# Patient Record
Sex: Male | Born: 1985 | Race: White | Hispanic: No | Marital: Single | Smoking: Current every day smoker
Health system: Southern US, Community
[De-identification: ages and names within clinical notes are randomized; demographics above are authoritative.]

## PROBLEM LIST (undated history)

## (undated) DIAGNOSIS — S42401A Unspecified fracture of lower end of right humerus, initial encounter for closed fracture: Secondary | ICD-10-CM

## (undated) DIAGNOSIS — J45909 Unspecified asthma, uncomplicated: Secondary | ICD-10-CM

## (undated) HISTORY — PX: TOOTH EXTRACTION: SUR596

## (undated) HISTORY — PX: KNEE ARTHROSCOPY: SHX127

## (undated) HISTORY — PX: HAND SURGERY: SHX662

---

## 2004-01-20 HISTORY — PX: ELBOW FRACTURE SURGERY: SHX616

## 2013-12-14 ENCOUNTER — Emergency Department (HOSPITAL_COMMUNITY): Payer: Self-pay

## 2013-12-14 ENCOUNTER — Encounter (HOSPITAL_COMMUNITY): Payer: Self-pay | Admitting: Emergency Medicine

## 2013-12-14 ENCOUNTER — Inpatient Hospital Stay (HOSPITAL_COMMUNITY)
Admission: EM | Admit: 2013-12-14 | Discharge: 2013-12-16 | DRG: 502 | Disposition: A | Discharge status: Home / Self Care | Payer: Self-pay | Attending: Family Medicine | Admitting: Family Medicine

## 2013-12-14 DIAGNOSIS — Z87891 Personal history of nicotine dependence: Secondary | ICD-10-CM

## 2013-12-14 DIAGNOSIS — L03113 Cellulitis of right upper limb: Secondary | ICD-10-CM

## 2013-12-14 DIAGNOSIS — L0390 Cellulitis, unspecified: Secondary | ICD-10-CM | POA: Insufficient documentation

## 2013-12-14 DIAGNOSIS — M71121 Other infective bursitis, right elbow: Principal | ICD-10-CM | POA: Diagnosis present

## 2013-12-14 DIAGNOSIS — L03119 Cellulitis of unspecified part of limb: Secondary | ICD-10-CM | POA: Diagnosis present

## 2013-12-14 DIAGNOSIS — M7021 Olecranon bursitis, right elbow: Secondary | ICD-10-CM

## 2013-12-14 DIAGNOSIS — D649 Anemia, unspecified: Secondary | ICD-10-CM | POA: Diagnosis present

## 2013-12-14 DIAGNOSIS — M25529 Pain in unspecified elbow: Secondary | ICD-10-CM

## 2013-12-14 HISTORY — DX: Unspecified asthma, uncomplicated: J45.909

## 2013-12-14 HISTORY — DX: Unspecified fracture of lower end of right humerus, initial encounter for closed fracture: S42.401A

## 2013-12-14 LAB — CBC WITH DIFFERENTIAL/PLATELET
BASOS ABS: 0 10*3/uL (ref 0.0–0.1)
BASOS PCT: 0 % (ref 0–1)
Eosinophils Absolute: 0 10*3/uL (ref 0.0–0.7)
Eosinophils Relative: 0 % (ref 0–5)
HCT: 37.4 % — ABNORMAL LOW (ref 39.0–52.0)
Hemoglobin: 12.7 g/dL — ABNORMAL LOW (ref 13.0–17.0)
LYMPHS PCT: 23 % (ref 12–46)
Lymphs Abs: 2.6 10*3/uL (ref 0.7–4.0)
MCH: 32 pg (ref 26.0–34.0)
MCHC: 34 g/dL (ref 30.0–36.0)
MCV: 94.2 fL (ref 78.0–100.0)
MONO ABS: 1.5 10*3/uL — AB (ref 0.1–1.0)
Monocytes Relative: 13 % — ABNORMAL HIGH (ref 3–12)
NEUTROS PCT: 64 % (ref 43–77)
Neutro Abs: 7.2 10*3/uL (ref 1.7–7.7)
Platelets: 206 10*3/uL (ref 150–400)
RBC: 3.97 MIL/uL — ABNORMAL LOW (ref 4.22–5.81)
RDW: 11.5 % (ref 11.5–15.5)
WBC: 11.5 10*3/uL — ABNORMAL HIGH (ref 4.0–10.5)

## 2013-12-14 LAB — BASIC METABOLIC PANEL
Anion gap: 14 (ref 5–15)
BUN: 16 mg/dL (ref 6–23)
CHLORIDE: 97 meq/L (ref 96–112)
CO2: 25 meq/L (ref 19–32)
Calcium: 9.6 mg/dL (ref 8.4–10.5)
Creatinine, Ser: 1.14 mg/dL (ref 0.50–1.35)
GFR calc non Af Amer: 85 mL/min — ABNORMAL LOW (ref >90)
Glucose, Bld: 109 mg/dL — ABNORMAL HIGH (ref 70–99)
POTASSIUM: 3.9 meq/L (ref 3.7–5.3)
Sodium: 136 mEq/L — ABNORMAL LOW (ref 137–147)

## 2013-12-14 LAB — SEDIMENTATION RATE: Sed Rate: 40 mm/hr — ABNORMAL HIGH (ref 0–16)

## 2013-12-14 MED ORDER — VANCOMYCIN HCL IN DEXTROSE 1-5 GM/200ML-% IV SOLN: 1000.0000 mg | Freq: Once | INTRAVENOUS | Status: DC

## 2013-12-14 MED ORDER — MORPHINE SULFATE 2 MG/ML IJ SOLN
1.0000 mg | INTRAMUSCULAR | Status: DC | PRN
Start: 2013-12-14 — End: 2013-12-15

## 2013-12-14 MED ORDER — PIPERACILLIN-TAZOBACTAM 3.375 G IVPB
3.3750 g | Freq: Three times a day (TID) | INTRAVENOUS | Status: DC
  Administered 2013-12-14 – 2013-12-16 (×5): 3.375 g via INTRAVENOUS
  Filled 2013-12-14 (×5): qty 50

## 2013-12-14 MED ORDER — ACETAMINOPHEN 325 MG PO TABS: 650.0000 mg | ORAL_TABLET | Freq: Four times a day (QID) | ORAL | Status: DC | PRN

## 2013-12-14 MED ORDER — SODIUM CHLORIDE 0.9 % IV BOLUS (SEPSIS)
1000.0000 mL | Freq: Once | INTRAVENOUS | Status: DC
  Administered 2013-12-14: 1000 mL via INTRAVENOUS

## 2013-12-14 MED ORDER — ONDANSETRON HCL 4 MG/2ML IJ SOLN
4.0000 mg | Freq: Once | INTRAMUSCULAR | Status: AC
  Administered 2013-12-14: 4 mg via INTRAVENOUS
  Filled 2013-12-14: qty 2

## 2013-12-14 MED ORDER — ONDANSETRON HCL 4 MG/2ML IJ SOLN: 4.0000 mg | Freq: Four times a day (QID) | INTRAMUSCULAR | Status: DC | PRN

## 2013-12-14 MED ORDER — SODIUM CHLORIDE 0.9 % IV BOLUS (SEPSIS)
1000.0000 mL | Freq: Once | INTRAVENOUS | Status: AC
  Administered 2013-12-14: 1000 mL via INTRAVENOUS

## 2013-12-14 MED ORDER — ACETAMINOPHEN 650 MG RE SUPP: 650.0000 mg | Freq: Four times a day (QID) | RECTAL | Status: DC | PRN

## 2013-12-14 MED ORDER — VANCOMYCIN HCL IN DEXTROSE 1-5 GM/200ML-% IV SOLN
1000.0000 mg | Freq: Once | INTRAVENOUS | Status: AC
  Administered 2013-12-14: 1000 mg via INTRAVENOUS
  Filled 2013-12-14: qty 200

## 2013-12-14 MED ORDER — ONDANSETRON HCL 4 MG PO TABS: 4.0000 mg | ORAL_TABLET | Freq: Four times a day (QID) | ORAL | Status: DC | PRN

## 2013-12-14 MED ORDER — ENOXAPARIN SODIUM 40 MG/0.4ML ~~LOC~~ SOLN
40.0000 mg | SUBCUTANEOUS | Status: DC
  Administered 2013-12-15 – 2013-12-16 (×2): 40 mg via SUBCUTANEOUS
  Filled 2013-12-14 (×3): qty 0.4

## 2013-12-14 MED ORDER — SODIUM CHLORIDE 0.9 % IV SOLN
INTRAVENOUS | Status: DC
  Administered 2013-12-14: 23:00:00 via INTRAVENOUS

## 2013-12-14 MED ORDER — VANCOMYCIN HCL IN DEXTROSE 1-5 GM/200ML-% IV SOLN
1000.0000 mg | Freq: Three times a day (TID) | INTRAVENOUS | Status: DC
  Administered 2013-12-15: 1000 mg via INTRAVENOUS
  Filled 2013-12-14 (×3): qty 200

## 2013-12-14 NOTE — H&P (Addendum)
[facility] History and Physical  Richard Humphrey ZOX:[id number] DOB: March 22, 1985 DOA: 12/14/2013  Referring physician: ER physician. PCP: No primary care provider on file.   Chief Complaint: Right elbow swelling and pain.  HPI: Richard GustinBobby Humphrey is a 28 y.o. male with no significant past medical history presents with worsening swelling and pain of the right elbow. Patient states that he noticed a small pimple like lesion last month which suddenly grew big last Monday 5 days ago. He had gone to his PCP on Tuesday and had incision and drainage and antibiotics started. Despite which patient's lesion became bigger and painful and increasing discharge. In the ER patient was seen by Dr. Carola FrostHandy on call hand surgeon and at this time Dr. Carola FrostHandy has requested admission and IV antibiotics. In the ER and Dr. Carola FrostHandy has done incision and drainage and patient has been placed on sling. Patient has had a past medical history of having fractured the elbow and has metal plates inside. Patient also had some subjective feeling of fever chills. Patient otherwise denies any chest pain shortness of breath nausea vomiting abdominal pain diarrhea.   Review of Systems: As presented in the history of presenting illness, rest negative.  Past Medical History  Diagnosis Date  . Elbow fracture, right     with surgery  . Asthma     children    Past Surgical History  Procedure Laterality Date  . Knee arthroscopy Left   . Hand surgery Right    Social History:  reports that he has quit smoking. He does not have any smokeless tobacco history on file. He reports that he does not drink alcohol or use illicit drugs. Where does patient live home. Can patient participate in ADLs? Yes.  No Known Allergies  Family History:  Family History  Problem Relation Age of Onset  . Diabetes Mellitus II Neg Hx       Prior to Admission medications   Medication Sig Start Date End Date Taking? Authorizing Provider   Diphenhydramine-APAP, sleep, (GOODYS PM PO) Take 1 packet by mouth daily as needed (pain).   Yes Historical Provider, MD  ibuprofen (ADVIL,MOTRIN) 200 MG tablet Take 800 mg by mouth 2 (two) times daily as needed for moderate pain (pain).   Yes Historical Provider, MD  minocycline (MINOCIN,DYNACIN) 100 MG capsule Take 100 mg by mouth 2 (two) times daily.   Yes Historical Provider, MD    Physical Exam: Filed Vitals:   12/14/13 1803 12/14/13 1831  BP: 124/79   Pulse: 111   Temp: 98.8 F (37.1 C)   TempSrc: Oral   Resp: 16   Height:  6\' 2"  (1.88 m)  Weight:  77.565 kg (171 lb)  SpO2: 100%      General:  Well-developed and nourished.  Eyes: Anicteric no pallor.  ENT: No discharge from the ears eyes nose and mouth.  Neck: No mass felt.  Cardiovascular: S1-S2 heard.  Respiratory: No rhonchi or crepitations.  Abdomen: Soft nontender bowel sounds present.  Skin: Patient's right elbow has been already dressed up after Dr. Carola FrostHandy is seen and patient has been placed on a sling at this time.  Musculoskeletal: Patient's right hand has been already placed on sling. Has good pulses. Patient is able to make a fist.  Psychiatric: Appears normal.  Neurologic: Alert awake oriented to time place and person. Moves all extremities.  Labs on Admission:  Basic Metabolic Panel:  Recent Labs Lab 12/14/13 2007  NA 136*  K 3.9  CL 97  CO2 25  GLUCOSE 109*  BUN 16  CREATININE 1.14  CALCIUM 9.6   Liver Function Tests: No results for input(s): AST, ALT, ALKPHOS, BILITOT, PROT, ALBUMIN in the last 168 hours. No results for input(s): LIPASE, AMYLASE in the last 168 hours. No results for input(s): AMMONIA in the last 168 hours. CBC:  Recent Labs Lab 12/14/13 2007  WBC 11.5*  NEUTROABS 7.2  HGB 12.7*  HCT 37.4*  MCV 94.2  PLT 206   Cardiac Enzymes: No results for input(s): CKTOTAL, CKMB, CKMBINDEX, TROPONINI in the last 168 hours.  BNP (last 3 results) No results for  input(s): PROBNP in the last 8760 hours. CBG: No results for input(s): GLUCAP in the last 168 hours.  Radiological Exams on Admission: Dg Elbow Complete Right  12/14/2013   CLINICAL DATA:  Redness and swelling for 4 days  EXAM: RIGHT ELBOW - COMPLETE 3+ VIEW  COMPARISON:  None.  FINDINGS: Frontal, lateral, and bilateral oblique views were obtained. There is postoperative change throughout the proximal ulna as well as in the distal humerus. No acute fracture or dislocation is seen. There is no appreciable joint effusion. 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 There is no erosive change or bony destruction appreciable. There is soft tissue swelling in the region of the olecranon bursa. There is no air-fluid level to suggest abscess.  IMPRESSION: Soft tissues swelling in the olecranon bursa region. No soft tissue abscess is seen by radiography. 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 No bony destruction or erosion. No fracture or dislocation.   Electronically Signed   By: Bretta BangWilliam  Woodruff M.D.   On: 12/14/2013 19:11     Assessment/Plan Principal Problem:   Cellulitis of elbow Active Problems:   Normocytic anemia   1. Right elbow cellulitis and abscess - Dr. Carola FrostHandy has incised and drained in the ER. I did discuss with Dr. Carola FrostHandy. At this time Dr. Carola FrostHandy has advised patient to be placed on antibiotics and patient's wound culture has been sent. Further antibiotics will be based on patient's wound cultures. Dr. Carola FrostHandy will be seeing in outpatient on Wednesday, December 2 in his office but advised that patient will need at least 2 days of IV antibiotics and further oral antibiotics should be based on cultures results. If patient's symptoms worsened while in the hospital to call Dr. Carola FrostHandy back again otherwise patient will be seen as outpatient and further workup would be done as outpatient including further procedures later. 2. Anemia, normocytic normochromic - check anemia  panel and if there is no significant fall in hemoglobin patient will definitely need further workup as outpatient. Patient states there is no family history of colon cancer or stomach cancer. Patient states he has been recently taking ibuprofen for pain but has not noticed any blood in the stools. I have advised patient that he will need further workup on this as outpatient and patient agreed to pursue as outpatient.    Code Status: Full code.  Family Communication: None.  Disposition Plan: Admit to inpatient.    Bennetta Rudden N. [facility] Pager [phone].  If 7PM-7AM, please contact night-coverage [email or web address] Password [email or web address] 12/14/2013, 9:45 PM

## 2013-12-14 NOTE — ED Provider Notes (Signed)
CSN: [id number]     Arrival date & time 12/14/13  1748 History   First MD Initiated Contact with Patient 12/14/13 1749     Chief Complaint  Patient presents with  . Elbow Pain     (Consider location/radiation/quality/duration/timing/severity/associated sxs/prior Treatment) HPI    Patient to the ER with complaints of right elbow pain. He reports that the symptoms started off as a pimple on his elbow that opened and drained. Then 4 days ago it started to get worse, he saw a PCP at [facility] where fluid was drawn (unclear if from Bursitis or intra articular) and started on Doxycycline. He reports that he has had three doses of the medications but the swelling in his elbow is now red, weeping, and redness and pain has spread to the wrist and up towards the shoulder. He reports low grade fevers and myalgias (primarily in the low back pain and legs). Pt does not appear toxic, but has a pulse of 111.  Patient has hardware in right elbow from previous injury.   Past Medical History  Diagnosis Date  . Elbow fracture, right     with surgery  . Asthma     children    Past Surgical History  Procedure Laterality Date  . Knee arthroscopy Left   . Hand surgery Right    No family history on file. History  Substance Use Topics  . Smoking status: Former Games developermoker  . Smokeless tobacco: Not on file  . Alcohol Use: No    Review of Systems 10 Systems reviewed and are negative for acute change except as noted in the HPI.      Allergies  Review of patient's allergies indicates no known allergies.  Home Medications   Prior to Admission medications   Medication Sig Start Date End Date Taking? Authorizing Provider  Diphenhydramine-APAP, sleep, (GOODYS PM PO) Take 1 packet by mouth daily as needed (pain).   Yes Historical Provider, MD  ibuprofen (ADVIL,MOTRIN) 200 MG tablet Take 800 mg by mouth 2 (two) times daily as needed for moderate pain (pain).   Yes Historical Provider, MD    minocycline (MINOCIN,DYNACIN) 100 MG capsule Take 100 mg by mouth 2 (two) times daily.   Yes Historical Provider, MD   BP 124/79 mmHg  Pulse 111  Temp(Src) 98.8 F (37.1 C) (Oral)  Resp 16  Ht 6\' 2"  (1.88 m)  Wt 171 lb (77.565 kg)  BMI 21.95 kg/m2  SpO2 100% Physical Exam  Constitutional: He appears well-developed and well-nourished. No distress.  HENT:  Head: Normocephalic and atraumatic.  Eyes: Pupils are equal, round, and reactive to light.  Neck: Normal range of motion. Neck supple.  Cardiovascular: Normal rate and regular rhythm.   Pulmonary/Chest: Effort normal.  Abdominal: Soft.  Musculoskeletal:       Right elbow: He exhibits decreased range of motion (unable to  fully extend his elbow), swelling (significant amount of swellling) and effusion. Tenderness found. Olecranon process tenderness noted.       Arms: Significantly amount of swelling and cellulitis to the right arm. Pain with resistance against flexion.  Neurological: He is alert.  Skin: Skin is warm and dry.  Nursing note and vitals reviewed.   ED Course  Procedures (including critical care time) Labs Review Labs Reviewed  CBC WITH DIFFERENTIAL - Abnormal; Notable for the following:    WBC 11.5 (*)    RBC 3.97 (*)    Hemoglobin 12.7 (*)    HCT 37.4 (*)  Monocytes Relative 13 (*)    Monocytes Absolute 1.5 (*)    All other components within normal limits  BASIC METABOLIC PANEL - Abnormal; Notable for the following:    Sodium 136 (*)    Glucose, Bld 109 (*)    GFR calc non Af Amer 85 (*)    All other components within normal limits  CULTURE, BLOOD (ROUTINE X 2)  CULTURE, BLOOD (ROUTINE X 2)  SEDIMENTATION RATE  C-REACTIVE PROTEIN    Imaging Review Dg Elbow Complete Right  12/14/2013   CLINICAL DATA:  Redness and swelling for 4 days  EXAM: RIGHT ELBOW - COMPLETE 3+ VIEW  COMPARISON:  None.  FINDINGS: Frontal, lateral, and bilateral oblique views were obtained. There is postoperative change  throughout the proximal ulna as well as in the distal humerus. No acute fracture or dislocation is seen. There is no appreciable joint effusion. 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 There is no erosive change or bony destruction appreciable. There is soft tissue swelling in the region of the olecranon bursa. There is no air-fluid level to suggest abscess.  IMPRESSION: Soft tissues swelling in the olecranon bursa region. No soft tissue abscess is seen by radiography. 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 No bony destruction or erosion. No fracture or dislocation.   Electronically Signed   By: Bretta BangWilliam  Woodruff M.D.   On: 12/14/2013 19:11     EKG Interpretation None      MDM   Final diagnoses:  Elbow pain  Cellulitis of right arm  Bursitis of elbow, right    Medications  sodium chloride 0.9 % bolus 1,000 mL (1,000 mLs Intravenous New Bag/Given 12/14/13 2036)  vancomycin (VANCOCIN) IVPB 1000 mg/200 mL premix (1,000 mg Intravenous New Bag/Given 12/14/13 2044)  sodium chloride 0.9 % bolus 1,000 mL (not administered)  ondansetron (ZOFRAN) injection 4 mg (4 mg Intravenous Given 12/14/13 2041)   Infectious is significant. Pt is having low grade temperatures, myalgias and worsening infection despite 3 does of antibiotics.  His PCP drained fluid from it a few days ago and since it is Thanksgiving I will not be able to obtain the results today.. Discussed case with Dr. Criss AlvineGoldston, S.  7:30 pm I did not order blood cultures but I asked the nurse to order them before starting abx in case the Admitting provider wants them  8:30 pm IV Vancomycin 1g  8:40 pm I spoke with Dr. Carola FrostHandy, he requests the patient stay NPO. Also recommends adding on CRP and SED.  Dr. Toniann FailKakrakandy has agreed to admit and has put in orders for this patient.   Dorthula Matasiffany G Lenox Ladouceur, PA-C 12/14/13 2111  Audree CamelScott T Goldston, MD 12/14/13 [phone]

## 2013-12-14 NOTE — Progress Notes (Signed)
ANTIBIOTIC CONSULT NOTE - INITIAL  Pharmacy Consult for Vancomycin and Zosyn Indication: elbow cellulitis with suspected abscess  No Known Allergies  Patient Measurements: Height: 6\' 2"  (188 cm) Weight: 171 lb (77.565 kg) IBW/kg (Calculated) : 82.2  Vital Signs: Temp: 98.5 F (36.9 C) (11/26 2149) Temp Source: Oral (11/26 2149) BP: 123/66 mmHg (11/26 2149) Pulse Rate: 75 (11/26 2149) Intake/Output from previous day:   Intake/Output from this shift:    Labs:  Recent Labs  12/14/13 2007  WBC 11.5*  HGB 12.7*  PLT 206  CREATININE 1.14   Estimated Creatinine Clearance: 104 mL/min (by C-G formula based on Cr of 1.14). No results for input(s): VANCOTROUGH, VANCOPEAK, VANCORANDOM, GENTTROUGH, GENTPEAK, GENTRANDOM, TOBRATROUGH, TOBRAPEAK, TOBRARND, AMIKACINPEAK, AMIKACINTROU, AMIKACIN in the last 72 hours.   Microbiology: No results found for this or any previous visit (from the past 720 hour(s)).  Medical History: Past Medical History  Diagnosis Date  . Elbow fracture, right     with surgery  . Asthma     children     Medications:  Prescriptions prior to admission  Medication Sig Dispense Refill Last Dose  . Diphenhydramine-APAP, sleep, (GOODYS PM PO) Take 1 packet by mouth daily as needed (pain).   12/13/2013 at Unknown time  . ibuprofen (ADVIL,MOTRIN) 200 MG tablet Take 800 mg by mouth 2 (two) times daily as needed for moderate pain (pain).   12/14/2013 at Unknown time  . minocycline (MINOCIN,DYNACIN) 100 MG capsule Take 100 mg by mouth 2 (two) times daily.   12/14/2013 at 0930   Assessment: 28yo M with swollen right elbow, draining clear fluid. Pharmacy is asked to dose Vanc and Zosyn for extensive cellulitis and suspected abscess. First dose of Vanc given in ED.  11/26 >> Vanc >> 11/26 >> Zosyn >>    Tmax: AF WBCs: slightly elevated Renal: SCr 1.14, CrCl >100  11/26 blood x 2: 11/26 elbow joint fluid:   Goal of Therapy:  Vancomycin trough level 15-20  mcg/ml  Appropriate antibiotic dosing for renal function; eradication of infection  Plan:   Vancomycin 1g IV q8h.  Zosyn 3.375g IV Q8H infused over 4hrs.  Measure Vanc trough at steady state.  Follow up renal fxn and culture results.  Charolotte Ekeom Inga Noller, PharmD, pager [phone]. 12/14/2013,10:41 PM.

## 2013-12-14 NOTE — ED Notes (Addendum)
Pt from home c/o R elbow pain. Pt sts that he had a "pimple" on his elbow approx 1 month ago. Pt reports that when he woke up approx 4 days ago with redness, swelling and a small, weeping open wound. Wound is weeping clear fluid a this time. Pt reports that today redness and swelling that has spread to hand and around to medial and anterior forearm. Pt went to PCP 4 days ago and was given abx and has been taking with no relief. Pt adds that he had surgery in 2006 where plates, pins and a rod was placed. 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  Pt denies fevers, N/V/D. Pt is A&O and in NAD

## 2013-12-14 NOTE — Plan of Care (Signed)
Problem: Consults Goal: General Medical Patient Education See Patient Education Module for specific education. Outcome: Completed/Met Date Met:  12/14/13 Goal: Skin Care Protocol Initiated - if Braden Score 18 or less If consults are not indicated, leave blank or document N/A Outcome: Not Applicable Date Met:  62/86/38 Goal: Nutrition Consult-if indicated Outcome: Not Applicable Date Met:  17/71/16 Goal: Diabetes Guidelines if Diabetic/Glucose > 140 If diabetic or lab glucose is > 140 mg/dl - Initiate Diabetes/Hyperglycemia Guidelines & Document Interventions  Outcome: Not Applicable Date Met:  57/90/38  Problem: Phase I Progression Outcomes Goal: Pain controlled with appropriate interventions Outcome: Completed/Met Date Met:  12/14/13 Goal: OOB as tolerated unless otherwise ordered Outcome: Completed/Met Date Met:  12/14/13 Goal: Voiding-avoid urinary catheter unless indicated Outcome: Completed/Met Date Met:  12/14/13

## 2013-12-14 NOTE — ED Notes (Signed)
[facility] with a swollen right elbow.  Patient states approximately one week ago he noticed a "pimple," on his right elbow.  It briefly drained fluid and stopped.  Patient states several days ago the elbow began swelling again and became red and warm to the touch.  Patient endorses fever, but denies N/V.  Patient has +2 radial pulses in RUE.  Elbow is visibly swollen and erythematous.  Elbow is also warm to the touch and draining clear fluid with movement.

## 2013-12-14 NOTE — Consult Note (Signed)
[facility] Consultation  Reason for Consult: Right elbow septic olecranon bursitis Referring Physician: Rise Patience, MD  Richard Humphrey is an 28 y.o. male.  HPI: Several day h/o of swelling and tenderness with new onset drainage. Has been tapped by local MD with return of largely serous but slightly cloudy fluid; started on doxycycline at that time; it has since has turned much more viscous and erythema and swelling has expanded proximally and distally so that patient came into ED tonight.  Denies fever, but mild malaise.  Past Medical History  Diagnosis Date  . Elbow fracture, right     with surgery  . Asthma     children     Past Surgical History  Procedure Laterality Date  . Knee arthroscopy Left   . Hand surgery Right     Family History  Problem Relation Age of Onset  . Diabetes Mellitus II Neg Hx     Social History:  reports that he has quit smoking. He does not have any smokeless tobacco history on file. He reports that he does not drink alcohol or use illicit drugs.  Allergies: No Known Allergies  Medications: I have reviewed the patient's current medications.  Results for orders placed or performed during the hospital encounter of 12/14/13 (from the past 48 hour(s))  CBC with Differential     Status: Abnormal   Collection Time: 12/14/13  8:07 PM  Result Value Ref Range   WBC 11.5 (H) 4.0 - 10.5 K/uL   RBC 3.97 (L) 4.22 - 5.81 MIL/uL   Hemoglobin 12.7 (L) 13.0 - 17.0 g/dL   HCT 37.4 (L) 39.0 - 52.0 %   MCV 94.2 78.0 - 100.0 fL   MCH 32.0 26.0 - 34.0 pg   MCHC 34.0 30.0 - 36.0 g/dL   RDW 11.5 11.5 - 15.5 %   Platelets 206 150 - 400 K/uL   Neutrophils Relative % 64 43 - 77 %   Neutro Abs 7.2 1.7 - 7.7 K/uL   Lymphocytes Relative 23 12 - 46 %   Lymphs Abs 2.6 0.7 - 4.0 K/uL   Monocytes Relative 13 (H) 3 - 12 %   Monocytes Absolute 1.5 (H) 0.1 - 1.0 K/uL   Eosinophils Relative 0 0 - 5 %   Eosinophils Absolute 0.0 0.0 - 0.7 K/uL   Basophils Relative 0 0 - 1 %   Basophils Absolute 0.0 0.0 - 0.1 K/uL  Basic metabolic panel     Status: Abnormal   Collection Time: 12/14/13  8:07 PM  Result Value Ref Range   Sodium 136 (L) 137 - 147 mEq/L   Potassium 3.9 3.7 - 5.3 mEq/L   Chloride 97 96 - 112 mEq/L   CO2 25 19 - 32 mEq/L   Glucose, Bld 109 (H) 70 - 99 mg/dL   BUN 16 6 - 23 mg/dL   Creatinine, Ser 1.14 0.50 - 1.35 mg/dL   Calcium 9.6 8.4 - 10.5 mg/dL   GFR calc non Af Amer 85 (L) >90 mL/min   GFR calc Af Amer >90 >90 mL/min    Comment: (NOTE) The eGFR has been calculated using the CKD EPI equation. This calculation has not been validated in all clinical situations. eGFR's persistently <90 mL/min signify possible Chronic Kidney Disease.    Anion gap 14 5 - 15  Sedimentation rate     Status: Abnormal   Collection Time: 12/14/13  8:07 PM  Result Value Ref Range   Sed Rate 40 (H) 0 -  16 mm/hr    Dg Elbow Complete Right  12/14/2013   CLINICAL DATA:  Redness and swelling for 4 days  EXAM: RIGHT ELBOW - COMPLETE 3+ VIEW  COMPARISON:  None.  FINDINGS: Frontal, lateral, and bilateral oblique views were obtained. There is postoperative change throughout the proximal ulna as well as in the distal humerus. No acute fracture or dislocation is seen. There is no appreciable joint effusion. 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 There is no erosive change or bony destruction appreciable. There is soft tissue swelling in the region of the olecranon bursa. There is no air-fluid level to suggest abscess.  IMPRESSION: Soft tissues swelling in the olecranon bursa region. No soft tissue abscess is seen by radiography. 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 No bony destruction or erosion. No fracture or dislocation.   Electronically Signed   By: Lowella Grip M.D.   On: 12/14/2013 19:11    ROS as above; No bleeding abnormalities, urologic dysfunction, GI problems, or weight gain.  Blood pressure  123/66, pulse 75, temperature 98.5 F (36.9 C), temperature source Oral, resp. rate 18, height 6' 2" (1.88 m), weight 171 lb (77.565 kg), SpO2 99 %. Physical Exam RUEx  Elbow tender, erythema, swelling posteriorly with fluctuant area; expressed 4-5cc of drainage which was sent for culture  No pain with AROM or PROM of elbow (w/o extremes), wrist or shoulder   Sens  Ax/R/M/U intact  Mot   Ax/ R/ PIN/ M/ AIN/ U intact  Rad 2+  Assessment/Plan: Right elbow septic olecranon bursitis without loose hardware  1. Expressed and cultured fluid 2. Applied compressive dressing 3. Long arm splint 4. IV Abx for next 36 hrs or so then conversion to PO 5. F/u with me on Wednesday, possibly even Monday of next week 6. Plan for elective removal of hardware once infection controlled  Altamese Freeburg, MD [facility] [phone] (p)   12/14/2013  11:45 PM

## 2013-12-15 LAB — BASIC METABOLIC PANEL
Anion gap: 9 (ref 5–15)
BUN: 15 mg/dL (ref 6–23)
CALCIUM: 9.1 mg/dL (ref 8.4–10.5)
CO2: 26 meq/L (ref 19–32)
Chloride: 104 mEq/L (ref 96–112)
Creatinine, Ser: 1.07 mg/dL (ref 0.50–1.35)
GFR calc Af Amer: >90 mL/min (ref >90)
GFR calc non Af Amer: >90 mL/min (ref >90)
Glucose, Bld: 102 mg/dL — ABNORMAL HIGH (ref 70–99)
Potassium: 4.7 mEq/L (ref 3.7–5.3)
SODIUM: 139 meq/L (ref 137–147)

## 2013-12-15 LAB — RETICULOCYTES
RBC.: 3.97 MIL/uL — ABNORMAL LOW (ref 4.22–5.81)
Retic Count, Absolute: 47.6 10*3/uL (ref 19.0–186.0)
Retic Ct Pct: 1.2 % (ref 0.4–3.1)

## 2013-12-15 LAB — IRON AND TIBC
Iron: 22 ug/dL — ABNORMAL LOW (ref 42–135)
SATURATION RATIOS: 10 % — AB (ref 20–55)
TIBC: 220 ug/dL (ref 215–435)
UIBC: 198 ug/dL (ref 125–400)

## 2013-12-15 LAB — CBC
HEMATOCRIT: 38.4 % — AB (ref 39.0–52.0)
Hemoglobin: 12.8 g/dL — ABNORMAL LOW (ref 13.0–17.0)
MCH: 32.2 pg (ref 26.0–34.0)
MCHC: 33.3 g/dL (ref 30.0–36.0)
MCV: 96.7 fL (ref 78.0–100.0)
Platelets: 173 10*3/uL (ref 150–400)
RBC: 3.97 MIL/uL — ABNORMAL LOW (ref 4.22–5.81)
RDW: 11.6 % (ref 11.5–15.5)
WBC: 8.1 10*3/uL (ref 4.0–10.5)

## 2013-12-15 LAB — FOLATE: Folate: 13.4 ng/mL

## 2013-12-15 LAB — MRSA PCR SCREENING: MRSA by PCR: NEGATIVE

## 2013-12-15 LAB — C-REACTIVE PROTEIN: CRP: 13.4 mg/dL — ABNORMAL HIGH (ref <0.60)

## 2013-12-15 LAB — FERRITIN: FERRITIN: 360 ng/mL — AB (ref 22–322)

## 2013-12-15 LAB — VITAMIN B12: Vitamin B-12: 381 pg/mL (ref 211–911)

## 2013-12-15 MED ORDER — AMOXICILLIN-POT CLAVULANATE 875-125 MG PO TABS: 1.0000 | ORAL_TABLET | Freq: Two times a day (BID) | ORAL | Status: DC

## 2013-12-15 MED ORDER — CIPROFLOXACIN HCL 500 MG PO TABS: 500.0000 mg | ORAL_TABLET | Freq: Two times a day (BID) | ORAL | Status: DC

## 2013-12-15 NOTE — Progress Notes (Addendum)
CARE MANAGEMENT NOTE 12/15/2013  Patient:  Richard Humphrey,Richard Humphrey   Account Number:  [id number]  Date Initiated:  12/15/2013  Documentation initiated by:  Ferdinand CavaSCHETTINO,Layia Walla  Subjective/Objective Assessment:   28 yo male admitted with cellulitis from home     Action/Plan:   discharge planning   Anticipated DC Date:  12/17/2013   Anticipated DC Plan:  HOME/SELF CARE      DC Planning Services  CM consult  PCP issues      Choice offered to / List presented to:             Status of service:  Completed, signed off Medicare Important Message given?   (If response is "NO", the following Medicare IM given date fields will be blank) Date Medicare IM given:   Medicare IM given by:   Date Additional Medicare IM given:   Additional Medicare IM given by:    Discharge Disposition:  HOME/SELF CARE  Per UR Regulation:  Reviewed for med. necessity/level of care/duration of stay  If discussed at Long Length of Stay Meetings, dates discussed:    Comments:  12/15/13 Ferdinand CavaAndrea Schettino RN BSN CM [phone] Patient [place] with [facility]. He stated the program is for 7 months and he is 3 months into the program. He stated that he has been in contact with the [facility] director. He stated that the program contracts with an MD in [place] but he is unable to afford the cost. Provided the [facility] and encouraged him to call and set up an initial appointment and apply for financial resources for greater affordability of care and ability to follow up with specialist, ie Ortho. Patient was appreciative of resources provided.  12/15/13 Ferdinand CavaAndrea Schettino RN BSN CM [phone] Discussed the [facility]ATCH program with the patient, explained that the copay for each medication will be $3 so he will have to pay a total of $6 for both discharge prescriptions, he can only use the program one  time in a year, and he has 7 days from the date of discharge to fill the prescriptions with [facility]. The patient stated that he has the $ to fill the prescriptions. M[facility] letter provided to patient with list of accepting pharmacies. Patient verbalized understanding with no questions or concerns.

## 2013-12-15 NOTE — Progress Notes (Signed)
CSW met with pt at bedside. Pt shared he is at [facility], Substance abuse residential rehab. Patient plans to return when medically stable. CSW spoke with Patrick Jupiter at [facility] who states that patient does not need anything besides a copy of his discharge papers and he can provide transportation when pt is discharged. No further Clinical Social Work needs, signing off.   Noreene Larsson [phone]  ED CSW 12/15/2013 1428pm

## 2013-12-15 NOTE — Discharge Instructions (Signed)
Orthopaedic DC instructions  Do not remove splint until follow up Do not get splint wet Ok to move fingers to help control swelling

## 2013-12-15 NOTE — Progress Notes (Signed)
  PROGRESS NOTE  Richard GustinBobby Humphrey WUJ:[id number] DOB: 30-Oct-1985 DOA: 12/14/2013 PCP: No primary care provider on file.  Summary: 28yom presented with right elbow septic olecranon bursitis. S/p I&D and admitted for IV abx.  Assessment/Plan: 1. Right olecranon septic bursitis. S/p I&D 11/26. Afebrile, vitals stable. Culture pending. Patient requests no narcotics (h/o narcotic abuse). 2. Mild normocytic anemia.   Improved per Dr. Carola FrostHandy. Plan IV abx today. Home on PO Cipro and Augmentin 11/28.  Sling for comfort.  Code Status: full code DVT prophylaxis: Lovenox Family Communication: none present Disposition Plan: home  Richard Sacksaniel Chrisoula Zegarra, MD  [facility]  Pager [phone] If 7PM-7AM, please contact night-coverage at [email or web address], password [email or web address] 12/15/2013, 2:07 PM  LOS: 1 day   Consultants:  Orthopedics  Procedures:   I&D 11/26  Antibiotics:  Zosyn 11/26 >>  Vancomycin 11/26 >> 11/27  HPI/Subjective: Better overall, has more swelling in hand. Minimal pain.  Objective: Filed Vitals:   12/14/13 1831 12/14/13 2149 12/15/13 0556 12/15/13 1328  BP:  123/66 105/56 115/69  Pulse:  75 74 84  Temp:  98.5 F (36.9 C) 97.7 F (36.5 C) 98.7 F (37.1 C)  TempSrc:  Oral Oral   Resp:  18 18 16   Height: 6\' 2"  (1.88 m)     Weight: 77.565 kg (171 lb)     SpO2:  99% 98% 100%    Intake/Output Summary (Last 24 hours) at 12/15/13 1407 Last data filed at 12/15/13 1100  Gross per 24 hour  Intake    240 ml  Output      0 ml  Net    240 ml     Filed Weights   12/14/13 1831  Weight: 77.565 kg (171 lb)    Exam:     Afebrile, VSS  General: appears calm and comfortable  Psych: alert, speech fluent and clear  CV: RRR no m/r/g. No LE edema.  Respiratory: CTA bilaterally no w/r/r. Normal resp effot.  Musculoskeletal: right hand slightly edematous, nontender, FROM, normal perfusion  Data Reviewed:  BMP, B12, CBC stable, WBC has normalized  Iron studies  equivocal  Scheduled Meds: . enoxaparin (LOVENOX) injection  40 mg Subcutaneous Q24H  . piperacillin-tazobactam (ZOSYN)  IV  3.375 g Intravenous Q8H  . vancomycin  1,000 mg Intravenous Q8H   Continuous Infusions: . sodium chloride 100 mL/hr at 12/14/13 2231    Principal Problem:   Cellulitis of elbow Active Problems:   Normocytic anemia   Time spent 20 minutes

## 2013-12-15 NOTE — Progress Notes (Addendum)
Orthopaedic Trauma Service (OTS)  Subjective: Feeling better this am with less pain  Objective:  Physical Exam Min edema of digits; splint fitting well; intact sens and motor In sling  Labs: CRP 13, ESR 40; Gram + cocci on wound drainage  Assessment/Plan: Continue with splint and compressive wrap until tomorrow; ok to leave on until follow up appt with me Mon, Wed at the latest; will sign off for now. Planning for empiric IV abx through today with d/c in am on PO Cipro and Augmentin Will tailor abx as cultures and sensitivities finalized. Sling for comfort only.   Richard Price, MD [facility] [phone] (p)

## 2013-12-16 MED ORDER — DOXYCYCLINE HYCLATE 100 MG PO TABS: 100.0000 mg | ORAL_TABLET | Freq: Two times a day (BID) | ORAL | Status: DC

## 2013-12-16 MED ORDER — AMOXICILLIN-POT CLAVULANATE 875-125 MG PO TABS
1.0000 | ORAL_TABLET | Freq: Two times a day (BID) | ORAL | Status: DC
  Administered 2013-12-16: 1 via ORAL
  Filled 2013-12-16 (×2): qty 1

## 2013-12-16 MED ORDER — CIPROFLOXACIN HCL 500 MG PO TABS
500.0000 mg | ORAL_TABLET | Freq: Two times a day (BID) | ORAL | Status: AC
  Administered 2013-12-16: 500 mg via ORAL
  Filled 2013-12-16: qty 1

## 2013-12-16 NOTE — Plan of Care (Signed)
Problem: Discharge Progression Outcomes Goal: Discharge plan in place and appropriate Outcome: Completed/Met Date Met:  12/16/13 Goal: Pain controlled with appropriate interventions Outcome: Completed/Met Date Met:  12/16/13 Goal: Hemodynamically stable Outcome: Completed/Met Date Met:  59/74/71 Goal: Complications resolved/controlled Outcome: Adequate for Discharge Goal: Tolerating diet Outcome: Completed/Met Date Met:  12/16/13 Goal: Activity appropriate for discharge plan Outcome: Completed/Met Date Met:  12/16/13

## 2013-12-16 NOTE — Discharge Summary (Signed)
Physician Discharge Summary  Richard GustinBobby Humphrey ZOX:[id number] DOB: 01/24/1985 DOA: 12/14/2013  PCP: No primary care provider on file.  Orthopedics: Dr. Carola FrostHandy  Admit date: 12/14/2013 Discharge date: 12/16/2013  Recommendations for Outpatient Follow-up:  1. Resolution of right olecranon septic bursitis. Cultures pending. 2. Borderline normocytic anemia.   Follow-up Information    Follow up with Richard PalmerHANDY,Richard H, MD On 12/20/2013.   Specialty:  Orthopedic Surgery   Why:  For wound re-check   Contact information:   [address] [place] [address] [phone]      Discharge Diagnoses:  1. Right olecranon septic bursitis 2. Borderline normocytic anemia   Discharge Condition: improved Disposition: home/[facility]  Diet recommendation: regular  Filed Weights   12/14/13 1831  Weight: 77.565 kg (171 lb)    History of present illness:  28yom presented with right elbow septic olecranon bursitis.   Hospital Course:  Underwent I&D per orthopedics and was admitted for IV abx. Condition improved, hospitalization uncomplicated. Culture data pending at time of discharge, will be followed by Dr. Carola FrostHandy. Mild normocytic anemia likely not clinically significant.  Consultants:  Orthopedics  Procedures:  I&D 11/26  Antibiotics:  Zosyn 11/26 >> 11/27  Vancomycin 11/26 >> 11/27  Cipro 11/28 >>   Augmentin 11/28 >>  Discharge Instructions  Discharge Instructions    Care order/instruction    Complete by:  As directed   Do not remove splint until follow up Do not get splint wet Ok to move fingers to help control swelling     Diet general    Complete by:  As directed      Discharge instructions    Complete by:  As directed   Call your physician or seek immediate medical attention for fever, increased pain, swelling, redness, drainage, arm or hand pain or numbness or worsening of condition.          Current Discharge Medication List    START  taking these medications   Details  amoxicillin-clavulanate (AUGMENTIN) 875-125 MG per tablet Take 1 tablet by mouth 2 (two) times daily. Qty: 42 tablet, Refills: 0    ciprofloxacin (CIPRO) 500 MG tablet Take 1 tablet (500 mg total) by mouth 2 (two) times daily. Qty: 42 tablet, Refills: 0      CONTINUE these medications which have NOT CHANGED   Details  ibuprofen (ADVIL,MOTRIN) 200 MG tablet Take 800 mg by mouth 2 (two) times daily as needed for moderate pain (pain).      STOP taking these medications     Diphenhydramine-APAP, sleep, (GOODYS PM PO)      minocycline (MINOCIN,DYNACIN) 100 MG capsule        No Known Allergies  The results of significant diagnostics from this hospitalization (including imaging, microbiology, ancillary and laboratory) are listed below for reference.    Significant Diagnostic Studies: Dg Elbow Complete Right  12/14/2013   CLINICAL DATA:  Redness and swelling for 4 days  EXAM: RIGHT ELBOW - COMPLETE 3+ VIEW  COMPARISON:  None.  FINDINGS: Frontal, lateral, and bilateral oblique views were obtained. There is postoperative change throughout the proximal ulna as well as in the distal humerus. No acute fracture or dislocation is seen. There is no appreciable joint effusion. 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 There is no erosive change or bony destruction appreciable. There is soft tissue swelling in the region of the olecranon bursa. There is no air-fluid level to suggest abscess.  IMPRESSION: Soft tissues swelling in the olecranon bursa region. No  soft tissue abscess is seen by radiography. 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 No bony destruction or erosion. No fracture or dislocation.   Electronically Signed   By: Bretta BangWilliam  Humphrey M.D.   On: 12/14/2013 19:11    Microbiology: Recent Results (from the past 240 hour(s))  Culture, routine-abscess     Status: None (Preliminary result)   Collection Time: 12/14/13  9:28 PM    Result Value Ref Range Status   Specimen Description WOUND ELBOW JOINT  Final   Special Requests Normal  Final   Gram Stain   Final    FEW WBC PRESENT,BOTH PMN AND MONONUCLEAR NO SQUAMOUS EPITHELIAL CELLS SEEN RARE GRAM POSITIVE COCCI IN PAIRS IN CLUSTERS Performed at [facility]    Culture NO GROWTH Performed at [facility]   Final   Report Status PENDING  Incomplete  MRSA PCR Screening     Status: None   Collection Time: 12/14/13 11:46 PM  Result Value Ref Range Status   MRSA by PCR NEGATIVE NEGATIVE Final    Comment:        The GeneXpert MRSA Assay (FDA approved for NASAL specimens only), is one component of a comprehensive MRSA colonization surveillance program. It is not intended to diagnose MRSA infection nor to guide or monitor treatment for MRSA infections.      Labs: Basic Metabolic Panel:  Recent Labs Lab 12/14/13 2007 12/15/13 0424  NA 136* 139  K 3.9 4.7  CL 97 104  CO2 25 26  GLUCOSE 109* 102*  BUN 16 15  CREATININE 1.14 1.07  CALCIUM 9.6 9.1   CBC:  Recent Labs Lab 12/14/13 2007 12/15/13 0424  WBC 11.5* 8.1  NEUTROABS 7.2  --   HGB 12.7* 12.8*  HCT 37.4* 38.4*  MCV 94.2 96.7  PLT 206 173    Principal Problem:   Cellulitis of elbow Active Problems:   Normocytic anemia   Time coordinating discharge: 20 minutes  Signed:  Brendia Sacksaniel Goodrich, MD [facility] 12/16/2013, 9:36 AM

## 2013-12-16 NOTE — Progress Notes (Signed)
  PROGRESS NOTE  Richard GustinBobby Harold ZOX:[id number] DOB: Oct 08, 1985 DOA: 12/14/2013 PCP: No primary care provider on file.  Summary: 28yom presented with right elbow septic olecranon bursitis. S/p I&D and admitted for IV abx.  Assessment/Plan: 1. Right olecranon septic bursitis. S/p I&D 11/26. Doing well.  2. Borderline normocytic anemia. Just below normal. Doubt significant.   Continues to improve afebrile. Plan for discharge today. Dr. Carola FrostHandy arranging close outpatient f/u and will f/u on culture data. Plan Augmentin and Cipro per Dr. Carola FrostHandy. (MRSA screen was negative).  No narcotics per patient request (h/o substance abuse). Has not even required Tylenol.  Sling for comfort.  Code Status: full code DVT prophylaxis: Lovenox Family Communication: none present Disposition Plan: home  Brendia Sacksaniel Terianne Thaker, MD  [facility]  Pager [phone] If 7PM-7AM, please contact night-coverage at [email or web address], password [email or web address] 12/16/2013, 9:17 AM  LOS: 2 days   Consultants:  Orthopedics  Procedures:   I&D 11/26  Antibiotics:  Zosyn 11/26 >> 11/27  Vancomycin 11/26 >> 11/27  Cipro 11/28 >>   Augmentin 11/28 >>   HPI/Subjective: Feeling much better. Hand swelling has resolved. Pain is improved.   Objective: Filed Vitals:   12/15/13 0556 12/15/13 1328 12/15/13 2035 12/16/13 0600  BP: 105/56 115/69 119/70 106/58  Pulse: 74 84 80 79  Temp: 97.7 F (36.5 C) 98.7 F (37.1 C) 98.5 F (36.9 C) 98.2 F (36.8 C)  TempSrc: Oral  Oral Oral  Resp: 18 16 17 16   Height:      Weight:      SpO2: 98% 100% 99% 98%    Intake/Output Summary (Last 24 hours) at 12/16/13 0917 Last data filed at 12/16/13 0645  Gross per 24 hour  Intake   1160 ml  Output      0 ml  Net   1160 ml     Filed Weights   12/14/13 1831  Weight: 77.565 kg (171 lb)    Exam:     Afebrile, vital signs stable.   Appears calm, comfortable. Speech fluent and clear.  Cardiovascular regular rate and rhythm. No  murmur, rub or gallop.  Respiratory clear to auscultation bilaterally. No wheezes, rales or rhonchi. Normal respiratory effort.  Right arm. Elbow bandaged. Forearm appears unremarkable. Strong radial pulse. Hand without edema. No erythema. Regimen movement appears grossly normal. Perfusion appears intact.  Data Reviewed:  Cultures are pending.  Scheduled Meds: . enoxaparin (LOVENOX) injection  40 mg Subcutaneous Q24H  . piperacillin-tazobactam (ZOSYN)  IV  3.375 g Intravenous Q8H   Continuous Infusions:    Principal Problem:   Cellulitis of elbow Active Problems:   Normocytic anemia

## 2013-12-18 LAB — CULTURE, ROUTINE-ABSCESS: Special Requests: NORMAL

## 2013-12-21 LAB — CULTURE, BLOOD (ROUTINE X 2)
CULTURE: NO GROWTH
Culture: NO GROWTH

## 2013-12-22 ENCOUNTER — Telehealth: Payer: Self-pay | Admitting: Family Medicine

## 2013-12-22 NOTE — Telephone Encounter (Signed)
Error- please disregard

## 2013-12-25 ENCOUNTER — Ambulatory Visit: Payer: Self-pay | Attending: Internal Medicine | Admitting: Internal Medicine

## 2013-12-25 ENCOUNTER — Encounter: Payer: Self-pay | Admitting: Internal Medicine

## 2013-12-25 VITALS — BP 112/76 | HR 78 | Temp 98.0°F | Resp 16 | Wt 176.8 lb

## 2013-12-25 DIAGNOSIS — Z23 Encounter for immunization: Secondary | ICD-10-CM | POA: Insufficient documentation

## 2013-12-25 DIAGNOSIS — Z87898 Personal history of other specified conditions: Secondary | ICD-10-CM | POA: Insufficient documentation

## 2013-12-25 DIAGNOSIS — D649 Anemia, unspecified: Secondary | ICD-10-CM | POA: Insufficient documentation

## 2013-12-25 DIAGNOSIS — J45909 Unspecified asthma, uncomplicated: Secondary | ICD-10-CM | POA: Insufficient documentation

## 2013-12-25 DIAGNOSIS — Z139 Encounter for screening, unspecified: Secondary | ICD-10-CM | POA: Insufficient documentation

## 2013-12-25 DIAGNOSIS — L03818 Cellulitis of other sites: Secondary | ICD-10-CM | POA: Insufficient documentation

## 2013-12-25 DIAGNOSIS — L03119 Cellulitis of unspecified part of limb: Secondary | ICD-10-CM

## 2013-12-25 DIAGNOSIS — Z2821 Immunization not carried out because of patient refusal: Secondary | ICD-10-CM | POA: Insufficient documentation

## 2013-12-25 DIAGNOSIS — Z87891 Personal history of nicotine dependence: Secondary | ICD-10-CM | POA: Insufficient documentation

## 2013-12-25 DIAGNOSIS — Z114 Encounter for screening for human immunodeficiency virus [HIV]: Secondary | ICD-10-CM | POA: Insufficient documentation

## 2013-12-25 DIAGNOSIS — F1911 Other psychoactive substance abuse, in remission: Secondary | ICD-10-CM | POA: Insufficient documentation

## 2013-12-25 LAB — COMPLETE METABOLIC PANEL WITH GFR
ALBUMIN: 4.4 g/dL (ref 3.5–5.2)
ALK PHOS: 54 U/L (ref 39–117)
ALT: 20 U/L (ref 0–53)
AST: 22 U/L (ref 0–37)
BUN: 14 mg/dL (ref 6–23)
CALCIUM: 10.2 mg/dL (ref 8.4–10.5)
CHLORIDE: 101 meq/L (ref 96–112)
CO2: 28 meq/L (ref 19–32)
CREATININE: 1.09 mg/dL (ref 0.50–1.35)
GLUCOSE: 87 mg/dL (ref 70–99)
POTASSIUM: 5.1 meq/L (ref 3.5–5.3)
Sodium: 140 mEq/L (ref 135–145)
Total Bilirubin: 0.3 mg/dL (ref 0.2–1.2)
Total Protein: 7.6 g/dL (ref 6.0–8.3)

## 2013-12-25 LAB — LIPID PANEL
CHOL/HDL RATIO: 3.5 ratio
CHOLESTEROL: 142 mg/dL (ref 0–200)
HDL: 41 mg/dL (ref >39)
LDL CALC: 84 mg/dL (ref 0–99)
Triglycerides: 84 mg/dL (ref <150)
VLDL: 17 mg/dL (ref 0–40)

## 2013-12-25 LAB — HEMOGLOBIN A1C
Hgb A1c MFr Bld: 5.3 % (ref <5.7)
MEAN PLASMA GLUCOSE: 105 mg/dL (ref <117)

## 2013-12-25 LAB — TSH: TSH: 3.283 u[IU]/mL (ref 0.350–4.500)

## 2013-12-25 NOTE — Progress Notes (Signed)
Patient here for hospital follow up and to establish care Had an injury to his right elbow back in 2006 Recently treated for cellulitis to the right elbow Denies any new injury stated does have screws in the elbow and might have irritated the area Currently taking cipro 500mg  BID

## 2013-12-25 NOTE — Progress Notes (Signed)
Patient Demographics  Richard Humphrey, is a 28 y.o. male  [id number]  NGE:[id number]  DOB - 06/05/1985  CC:  Chief Complaint  Patient presents with  . new patient       HPI: Richard Humphrey is a 28 y.o. male here today to establish medical care.Patient was recently hospitalized with right elbow septic olecranon bursitis, EMR reviewed patient underwent I&D and was given IV antibiotics and was discharged on Augmentin and Cipro,  as per patient he followed up with orthopedics and was advised to continue only with ciprofloxacin and discontinue Augmentin vision denies any fever chills denies any discharge has minimal pain , patient also reports prior history of substance abuse and is currently in the program, his blood work also noticed to have normocytic anemia patient denies any bleeding. Patient has No headache, No chest pain, No abdominal pain - No Nausea, No new weakness tingling or numbness, No Cough - SOB.  No Known Allergies Past Medical History  Diagnosis Date  . Elbow fracture, right     with surgery  . Asthma     children    Current Outpatient Prescriptions on File Prior to Visit  Medication Sig Dispense Refill  . amoxicillin-clavulanate (AUGMENTIN) 875-125 MG per tablet Take 1 tablet by mouth 2 (two) times daily. 42 tablet 0  . ciprofloxacin (CIPRO) 500 MG tablet Take 1 tablet (500 mg total) by mouth 2 (two) times daily. 42 tablet 0  . ibuprofen (ADVIL,MOTRIN) 200 MG tablet Take 800 mg by mouth 2 (two) times daily as needed for moderate pain (pain).     No current facility-administered medications on file prior to visit.   Family History  Problem Relation Age of Onset  . Diabetes Mellitus II Neg Hx    History   Social History  . Marital Status: Single    Spouse Name: N/A    Number of Children: N/A  . Years of Education: N/A   Occupational History  . Not on file.   Social History Main Topics  . Smoking status: Former Games developermoker  . Smokeless tobacco: Not on  file  . Alcohol Use: No  . Drug Use: No  . Sexual Activity: Not on file   Other Topics Concern  . Not on file   Social History Narrative    Review of Systems: Constitutional: Negative for fever, chills, diaphoresis, activity change, appetite change and fatigue. HENT: Negative for ear pain, nosebleeds, congestion, facial swelling, rhinorrhea, neck pain, neck stiffness and ear discharge.  Eyes: Negative for pain, discharge, redness, itching and visual disturbance. Respiratory: Negative for cough, choking, chest tightness, shortness of breath, wheezing and stridor.  Cardiovascular: Negative for chest pain, palpitations and leg swelling. Gastrointestinal: Negative for abdominal distention. Genitourinary: Negative for dysuria, urgency, frequency, hematuria, flank pain, decreased urine volume, difficulty urinating and dyspareunia.  Musculoskeletal: Negative for back pain, joint swelling, arthralgia and gait problem. Neurological: Negative for dizziness, tremors, seizures, syncope, facial asymmetry, speech difficulty, weakness, light-headedness, numbness and headaches.  Hematological: Negative for adenopathy. Does not bruise/bleed easily. Psychiatric/Behavioral: Negative for hallucinations, behavioral problems, confusion, dysphoric mood, decreased concentration and agitation.    Objective:   Filed Vitals:   12/25/13 1123  BP: 112/76  Pulse: 78  Temp: 98 F (36.7 C)  Resp: 16    Physical Exam: Constitutional: Patient appears well-developed and well-nourished. No distress. HENT: Normocephalic, atraumatic, External right and left ear normal. Oropharynx is clear and moist. Dental cavities. Eyes: Conjunctivae and EOM are normal. PERRLA, no scleral icterus.  Neck: Normal ROM. Neck supple. No JVD. No tracheal deviation. No thyromegaly. CVS: RRR, S1/S2 +, no murmurs, no gallops, no carotid bruit.  Pulmonary: Effort and breath sounds normal, no stridor, rhonchi, wheezes, rales.  Abdominal:  Soft. BS +, no distension, tenderness, rebound or guarding.  Musculoskeletal: right elbow erythema minimal swelling no apparent discharge no tenderness.  Neuro: Alert. Normal reflexes, muscle tone coordination. No cranial nerve deficit. Skin: Skin is warm and dry. No rash noted. Not diaphoretic. No erythema. No pallor. Psychiatric: Normal mood and affect. Behavior, judgment, thought content normal.  Lab Results  Component Value Date   WBC 8.1 12/15/2013   HGB 12.8* 12/15/2013   HCT 38.4* 12/15/2013   MCV 96.7 12/15/2013   PLT 173 12/15/2013   Lab Results  Component Value Date   CREATININE 1.07 12/15/2013   BUN 15 12/15/2013   NA 139 12/15/2013   K 4.7 12/15/2013   CL 104 12/15/2013   CO2 26 12/15/2013    No results found for: HGBA1C Lipid Panel  No results found for: CHOL, TRIG, HDL, CHOLHDL, VLDL, LDLCALC     Assessment and plan:   1. Cellulitis of elbow Currently patient is on antibiotic, scheduled to follow with orthopedics.  2. Screening Ordered baseline blood work. - COMPLETE METABOLIC PANEL WITH GFR - TSH - Lipid panel - Vit D  25 hydroxy (rtn osteoporosis monitoring) - Hemoglobin A1c  3. Need for Tdap vaccination Vaccination given today  4. History of substance abuse Will check - HIV antibody (with reflex)  Health Maintenance  -Vaccinations: tdap today  pateint declines flu shot   Return in about 3 months (around 03/26/2014).   Doris CheadleADVANI, Zakai Gonyea, MD

## 2013-12-26 ENCOUNTER — Telehealth: Payer: Self-pay | Admitting: Emergency Medicine

## 2013-12-26 LAB — VITAMIN D 25 HYDROXY (VIT D DEFICIENCY, FRACTURES): Vit D, 25-Hydroxy: 27 ng/mL — ABNORMAL LOW (ref 30–100)

## 2013-12-26 LAB — HIV ANTIBODY (ROUTINE TESTING W REFLEX): HIV 1&2 Ab, 4th Generation: NONREACTIVE

## 2013-12-26 NOTE — Telephone Encounter (Signed)
-----   Message from Doris Cheadleeepak Advani, MD sent at 12/26/2013  9:50 AM EST ----- Blood work reviewed, noticed low vitamin D, advise patient to start taking OTC 2000 units daily.

## 2013-12-26 NOTE — Telephone Encounter (Signed)
Number listed not available for VM

## 2014-01-04 ENCOUNTER — Encounter (HOSPITAL_COMMUNITY): Payer: Self-pay | Admitting: *Deleted

## 2014-01-04 NOTE — Progress Notes (Signed)
Please complete anesthesia complication assessment DOS since pre-op call was completed by pt counselor.

## 2014-01-04 NOTE — Progress Notes (Signed)
Pt SDW pre- call was completed by pt counselor Mr. Lucillie GarfinkelRandy McCoy ( # [phone]) at inpatient Rehab. Harvie HeckRandy advised that pt should not have any narcotics because pt is currently in recovery. Mr. Janece Canterburyat [facility], counselor will accompany pt to procedure ( # [phone]). Harvie HeckRandy made aware that pt should stop taking Aspirin, vitamins, and herbal medications. Do not take any NSAIDs ie: Ibuprofen, Advil, Naproxen or any medication containing Aspirin.

## 2014-01-05 ENCOUNTER — Encounter (HOSPITAL_COMMUNITY): Payer: Self-pay | Admitting: *Deleted

## 2014-01-05 ENCOUNTER — Encounter (HOSPITAL_COMMUNITY): Payer: Self-pay | Admitting: Pharmacy Technician

## 2014-01-05 ENCOUNTER — Ambulatory Visit (HOSPITAL_COMMUNITY): Payer: MEDICAID

## 2014-01-05 ENCOUNTER — Ambulatory Visit (HOSPITAL_COMMUNITY)
Admission: RE | Admit: 2014-01-05 | Discharge: 2014-01-05 | Disposition: A | Discharge status: Home / Self Care | Payer: Self-pay | Source: Ambulatory Visit | Attending: Orthopedic Surgery | Admitting: Orthopedic Surgery

## 2014-01-05 ENCOUNTER — Encounter (HOSPITAL_COMMUNITY)
Admission: RE | Disposition: A | Discharge status: Home / Self Care | Payer: Self-pay | Source: Ambulatory Visit | Attending: Orthopedic Surgery

## 2014-01-05 ENCOUNTER — Ambulatory Visit (HOSPITAL_COMMUNITY): Payer: MEDICAID | Admitting: Anesthesiology

## 2014-01-05 ENCOUNTER — Ambulatory Visit (HOSPITAL_COMMUNITY): Payer: Self-pay | Admitting: Anesthesiology

## 2014-01-05 DIAGNOSIS — T8484XA Pain due to internal orthopedic prosthetic devices, implants and grafts, initial encounter: Secondary | ICD-10-CM

## 2014-01-05 DIAGNOSIS — Z833 Family history of diabetes mellitus: Secondary | ICD-10-CM | POA: Insufficient documentation

## 2014-01-05 DIAGNOSIS — J45909 Unspecified asthma, uncomplicated: Secondary | ICD-10-CM | POA: Insufficient documentation

## 2014-01-05 DIAGNOSIS — Z87898 Personal history of other specified conditions: Secondary | ICD-10-CM | POA: Insufficient documentation

## 2014-01-05 DIAGNOSIS — Z87891 Personal history of nicotine dependence: Secondary | ICD-10-CM | POA: Insufficient documentation

## 2014-01-05 DIAGNOSIS — T8484XD Pain due to internal orthopedic prosthetic devices, implants and grafts, subsequent encounter: Secondary | ICD-10-CM

## 2014-01-05 DIAGNOSIS — M7021 Olecranon bursitis, right elbow: Secondary | ICD-10-CM | POA: Insufficient documentation

## 2014-01-05 HISTORY — PX: HARDWARE REMOVAL: SHX979

## 2014-01-05 LAB — CBC
HCT: 41.6 % (ref 39.0–52.0)
Hemoglobin: 14.3 g/dL (ref 13.0–17.0)
MCH: 32 pg (ref 26.0–34.0)
MCHC: 34.4 g/dL (ref 30.0–36.0)
MCV: 93.1 fL (ref 78.0–100.0)
Platelets: 181 10*3/uL (ref 150–400)
RBC: 4.47 MIL/uL (ref 4.22–5.81)
RDW: 11.5 % (ref 11.5–15.5)
WBC: 5.7 10*3/uL (ref 4.0–10.5)

## 2014-01-05 SURGERY — REMOVAL, HARDWARE
Anesthesia: Regional | Site: Arm Upper | Laterality: Right

## 2014-01-05 MED ORDER — PROPOFOL 10 MG/ML IV BOLUS
INTRAVENOUS | Status: AC
  Filled 2014-01-05: qty 20

## 2014-01-05 MED ORDER — LIDOCAINE HCL 4 % MT SOLN
OROMUCOSAL | Status: DC | PRN
  Administered 2014-01-05: 4 mL via TOPICAL

## 2014-01-05 MED ORDER — ONDANSETRON HCL 4 MG/2ML IJ SOLN
INTRAMUSCULAR | Status: DC | PRN
  Administered 2014-01-05: 4 mg via INTRAVENOUS

## 2014-01-05 MED ORDER — KETOROLAC TROMETHAMINE 10 MG PO TABS: 10.0000 mg | ORAL_TABLET | Freq: Four times a day (QID) | ORAL | Status: AC | PRN

## 2014-01-05 MED ORDER — LIDOCAINE HCL (CARDIAC) 20 MG/ML IV SOLN
INTRAVENOUS | Status: AC
  Filled 2014-01-05: qty 5

## 2014-01-05 MED ORDER — BUPIVACAINE-EPINEPHRINE (PF) 0.5% -1:200000 IJ SOLN
INTRAMUSCULAR | Status: DC | PRN
  Administered 2014-01-05: 30 mL via PERINEURAL

## 2014-01-05 MED ORDER — GLYCOPYRROLATE 0.2 MG/ML IJ SOLN
INTRAMUSCULAR | Status: DC | PRN
  Administered 2014-01-05: 0.4 mg via INTRAVENOUS
  Administered 2014-01-05: 0.2 mg via INTRAVENOUS

## 2014-01-05 MED ORDER — LACTATED RINGERS IV SOLN
INTRAVENOUS | Status: DC | PRN
  Administered 2014-01-05 (×2): via INTRAVENOUS

## 2014-01-05 MED ORDER — CEFAZOLIN SODIUM-DEXTROSE 2-3 GM-% IV SOLR
2.0000 g | Freq: Once | INTRAVENOUS | Status: AC
  Administered 2014-01-05: 2 g via INTRAVENOUS
  Filled 2014-01-05: qty 50

## 2014-01-05 MED ORDER — METHOCARBAMOL 500 MG PO TABS: 500.0000 mg | ORAL_TABLET | Freq: Four times a day (QID) | ORAL | Status: AC

## 2014-01-05 MED ORDER — ROCURONIUM BROMIDE 100 MG/10ML IV SOLN
INTRAVENOUS | Status: DC | PRN
  Administered 2014-01-05: 50 mg via INTRAVENOUS

## 2014-01-05 MED ORDER — IBUPROFEN 200 MG PO TABS: 600.0000 mg | ORAL_TABLET | Freq: Three times a day (TID) | ORAL | Status: DC | PRN

## 2014-01-05 MED ORDER — GLYCOPYRROLATE 0.2 MG/ML IJ SOLN
INTRAMUSCULAR | Status: AC
  Filled 2014-01-05: qty 3

## 2014-01-05 MED ORDER — FENTANYL CITRATE 0.05 MG/ML IJ SOLN: INTRAMUSCULAR | Status: DC | PRN

## 2014-01-05 MED ORDER — NEOSTIGMINE METHYLSULFATE 10 MG/10ML IV SOLN
INTRAVENOUS | Status: AC
  Filled 2014-01-05: qty 1

## 2014-01-05 MED ORDER — ACETAMINOPHEN 10 MG/ML IV SOLN
INTRAVENOUS | Status: AC
  Administered 2014-01-05: 1000 mg via INTRAVENOUS
  Filled 2014-01-05: qty 100

## 2014-01-05 MED ORDER — DEXMEDETOMIDINE BOLUS VIA INFUSION
INTRAVENOUS | Status: DC | PRN
  Administered 2014-01-05 (×2): 12 ug via INTRAVENOUS

## 2014-01-05 MED ORDER — PROPOFOL 10 MG/ML IV BOLUS
INTRAVENOUS | Status: DC | PRN
  Administered 2014-01-05: 200 mg via INTRAVENOUS

## 2014-01-05 MED ORDER — MIDAZOLAM HCL 2 MG/2ML IJ SOLN
INTRAMUSCULAR | Status: AC
  Filled 2014-01-05: qty 2

## 2014-01-05 MED ORDER — CIPROFLOXACIN HCL 500 MG PO TABS: 500.0000 mg | ORAL_TABLET | Freq: Two times a day (BID) | ORAL | Status: AC

## 2014-01-05 MED ORDER — LACTATED RINGERS IV SOLN
INTRAVENOUS | Status: DC
Start: 2014-01-05 — End: 2014-01-05
  Administered 2014-01-05: 08:00:00 via INTRAVENOUS

## 2014-01-05 MED ORDER — KETOROLAC TROMETHAMINE 30 MG/ML IJ SOLN
INTRAMUSCULAR | Status: DC | PRN
  Administered 2014-01-05: 30 mg via INTRAVENOUS

## 2014-01-05 MED ORDER — FENTANYL CITRATE 0.05 MG/ML IJ SOLN
INTRAMUSCULAR | Status: AC
  Filled 2014-01-05: qty 5

## 2014-01-05 MED ORDER — NEOSTIGMINE METHYLSULFATE 10 MG/10ML IV SOLN
INTRAVENOUS | Status: DC | PRN
  Administered 2014-01-05: 1 mg via INTRAVENOUS
  Administered 2014-01-05: 3 mg via INTRAVENOUS

## 2014-01-05 MED ORDER — 0.9 % SODIUM CHLORIDE (POUR BTL) OPTIME
TOPICAL | Status: DC | PRN
  Administered 2014-01-05: 1000 mL

## 2014-01-05 MED ORDER — LIDOCAINE HCL (CARDIAC) 20 MG/ML IV SOLN
INTRAVENOUS | Status: DC | PRN
  Administered 2014-01-05: 60 mg via INTRAVENOUS

## 2014-01-05 SURGICAL SUPPLY — 63 items
BANDAGE ELASTIC 3 VELCRO ST LF (GAUZE/BANDAGES/DRESSINGS) ×3 IMPLANT
BANDAGE ELASTIC 4 VELCRO ST LF (GAUZE/BANDAGES/DRESSINGS) ×3 IMPLANT
BANDAGE ELASTIC 6 VELCRO ST LF (GAUZE/BANDAGES/DRESSINGS) ×3 IMPLANT
BANDAGE ESMARK 6X9 LF (GAUZE/BANDAGES/DRESSINGS) ×1 IMPLANT
BNDG COHESIVE 6X5 TAN STRL LF (GAUZE/BANDAGES/DRESSINGS) ×3 IMPLANT
BNDG ESMARK 6X9 LF (GAUZE/BANDAGES/DRESSINGS) ×3
BNDG GAUZE ELAST 4 BULKY (GAUZE/BANDAGES/DRESSINGS) ×3 IMPLANT
BRUSH SCRUB DISP (MISCELLANEOUS) ×6 IMPLANT
CLEANER TIP ELECTROSURG 2X2 (MISCELLANEOUS) ×3 IMPLANT
CLOSURE WOUND 1/2 X4 (GAUZE/BANDAGES/DRESSINGS)
COVER SURGICAL LIGHT HANDLE (MISCELLANEOUS) ×6 IMPLANT
CUFF TOURNIQUET SINGLE 18IN (TOURNIQUET CUFF) IMPLANT
CUFF TOURNIQUET SINGLE 24IN (TOURNIQUET CUFF) IMPLANT
CUFF TOURNIQUET SINGLE 34IN LL (TOURNIQUET CUFF) IMPLANT
DRAPE C-ARM 42X72 X-RAY (DRAPES) IMPLANT
DRAPE C-ARMOR (DRAPES) ×3 IMPLANT
DRAPE OEC MINIVIEW 54X84 (DRAPES) ×6 IMPLANT
DRAPE PROXIMA HALF (DRAPES) ×3 IMPLANT
DRAPE U-SHAPE 47X51 STRL (DRAPES) ×3 IMPLANT
DRSG ADAPTIC 3X8 NADH LF (GAUZE/BANDAGES/DRESSINGS) ×3 IMPLANT
ELECT REM PT RETURN 9FT ADLT (ELECTROSURGICAL) ×3
ELECTRODE REM PT RTRN 9FT ADLT (ELECTROSURGICAL) ×1 IMPLANT
EVACUATOR 1/8 PVC DRAIN (DRAIN) IMPLANT
GAUZE SPONGE 4X4 12PLY STRL (GAUZE/BANDAGES/DRESSINGS) ×3 IMPLANT
GLOVE BIO SURGEON STRL SZ7.5 (GLOVE) ×3 IMPLANT
GLOVE BIO SURGEON STRL SZ8 (GLOVE) ×3 IMPLANT
GLOVE BIOGEL PI IND STRL 7.5 (GLOVE) ×1 IMPLANT
GLOVE BIOGEL PI IND STRL 8 (GLOVE) ×1 IMPLANT
GLOVE BIOGEL PI INDICATOR 7.5 (GLOVE) ×2
GLOVE BIOGEL PI INDICATOR 8 (GLOVE) ×2
GOWN STRL REUS W/ TWL LRG LVL3 (GOWN DISPOSABLE) ×2 IMPLANT
GOWN STRL REUS W/ TWL XL LVL3 (GOWN DISPOSABLE) ×1 IMPLANT
GOWN STRL REUS W/TWL LRG LVL3 (GOWN DISPOSABLE) ×4
GOWN STRL REUS W/TWL XL LVL3 (GOWN DISPOSABLE) ×2
KIT BASIN OR (CUSTOM PROCEDURE TRAY) ×3 IMPLANT
KIT ROOM TURNOVER OR (KITS) ×3 IMPLANT
MANIFOLD NEPTUNE II (INSTRUMENTS) ×3 IMPLANT
NEEDLE 22X1 1/2 (OR ONLY) (NEEDLE) IMPLANT
NS IRRIG 1000ML POUR BTL (IV SOLUTION) ×3 IMPLANT
PACK ORTHO EXTREMITY (CUSTOM PROCEDURE TRAY) ×3 IMPLANT
PAD ARMBOARD 7.5X6 YLW CONV (MISCELLANEOUS) ×6 IMPLANT
PADDING CAST COTTON 6X4 STRL (CAST SUPPLIES) ×9 IMPLANT
SPONGE LAP 18X18 X RAY DECT (DISPOSABLE) ×6 IMPLANT
SPONGE SCRUB IODOPHOR (GAUZE/BANDAGES/DRESSINGS) ×3 IMPLANT
STAPLER VISISTAT 35W (STAPLE) IMPLANT
STOCKINETTE IMPERVIOUS LG (DRAPES) ×3 IMPLANT
STRIP CLOSURE SKIN 1/2X4 (GAUZE/BANDAGES/DRESSINGS) IMPLANT
SUCTION FRAZIER TIP 10 FR DISP (SUCTIONS) IMPLANT
SUT ETHILON 2 0 PSLX (SUTURE) ×6 IMPLANT
SUT ETHILON 3 0 PS 1 (SUTURE) IMPLANT
SUT PDS AB 2-0 CT1 27 (SUTURE) ×6 IMPLANT
SUT VIC AB 0 CT1 27 (SUTURE)
SUT VIC AB 0 CT1 27XBRD ANBCTR (SUTURE) IMPLANT
SUT VIC AB 2-0 CT1 27 (SUTURE)
SUT VIC AB 2-0 CT1 TAPERPNT 27 (SUTURE) IMPLANT
SYR CONTROL 10ML LL (SYRINGE) IMPLANT
TOWEL OR 17X24 6PK STRL BLUE (TOWEL DISPOSABLE) ×6 IMPLANT
TOWEL OR 17X26 10 PK STRL BLUE (TOWEL DISPOSABLE) ×6 IMPLANT
TUBE CONNECTING 12'X1/4 (SUCTIONS) ×1
TUBE CONNECTING 12X1/4 (SUCTIONS) ×2 IMPLANT
UNDERPAD 30X30 INCONTINENT (UNDERPADS AND DIAPERS) ×3 IMPLANT
WATER STERILE IRR 1000ML POUR (IV SOLUTION) ×6 IMPLANT
YANKAUER SUCT BULB TIP NO VENT (SUCTIONS) ×3 IMPLANT

## 2014-01-05 NOTE — Anesthesia Preprocedure Evaluation (Signed)
Anesthesia Evaluation  Patient identified by MRN, date of birth, ID band Patient awake    Reviewed: Allergy & Precautions, H&P , NPO status , Patient's Chart, lab work & pertinent test results  Airway Mallampati: II   Neck ROM: full    Dental   Pulmonary asthma , former smoker,          Cardiovascular negative cardio ROS      Neuro/Psych    GI/Hepatic (+)     substance abuse  , Currently in drug rehab.   Endo/Other    Renal/GU      Musculoskeletal   Abdominal   Peds  Hematology   Anesthesia Other Findings   Reproductive/Obstetrics                             Anesthesia Physical Anesthesia Plan  ASA: II  Anesthesia Plan: General and Regional   Post-op Pain Management: MAC Combined w/ Regional for Post-op pain   Induction: Intravenous  Airway Management Planned: Oral ETT  Additional Equipment:   Intra-op Plan:   Post-operative Plan: Extubation in OR  Informed Consent: I have reviewed the patients History and Physical, chart, labs and discussed the procedure including the risks, benefits and alternatives for the proposed anesthesia with the patient or authorized representative who has indicated his/her understanding and acceptance.     Plan Discussed with: CRNA, Anesthesiologist and Surgeon  Anesthesia Plan Comments:         Anesthesia Quick Evaluation

## 2014-01-05 NOTE — Progress Notes (Signed)
This patient has been given discharge instructions by this RN.  I used a variety of media:  Verbal, Handouts and Teachback.  The patients family/friends were also included with discharge instructions.  Belongings were returned to the patient.  This patients pain is tolerable and is able to tolerate fluids.  Prescriptions if available were given to the family and/or the patient with instructions for taking.  Patient also received instructions for sling wearing

## 2014-01-05 NOTE — Discharge Instructions (Addendum)
[facility] Discharge Instructions   General Discharge Instructions  WEIGHT BEARING STATUS: Weightbearing as tolerate  RANGE OF MOTION/ACTIVITY: as tolerated   Wound Care: start daily dressing changes on 01/08/2014. Dry dressings only, can wash/clean with soap and water only. If draining, keep covered   Diet: as you were eating previously.  Can use over the counter stool softeners and bowel preparations, such as Miralax, to help with bowel movements.  Narcotics can be constipating.  Be sure to drink plenty of fluids  STOP SMOKING OR USING NICOTINE PRODUCTS!!!!  As discussed nicotine severely impairs your body's ability to heal surgical and traumatic wounds but also impairs bone healing.  Wounds and bone heal by forming microscopic blood vessels (angiogenesis) and nicotine is a vasoconstrictor (essentially, shrinks blood vessels).  Therefore, if vasoconstriction occurs to these microscopic blood vessels they essentially disappear and are unable to deliver necessary nutrients to the healing tissue.  This is one modifiable factor that you can do to dramatically increase your chances of healing your injury.    (This means no smoking, no nicotine gum, patches, etc)  DO NOT USE NONSTEROIDAL ANTI-INFLAMMATORY DRUGS (NSAID'S)  Using products such as Advil (ibuprofen), Aleve (naproxen), Motrin (ibuprofen) for additional pain control during fracture healing can delay and/or prevent the healing response.  If you would like to take over the counter (OTC) medication, Tylenol (acetaminophen) is ok.  However, some narcotic medications that are given for pain control contain acetaminophen as well. Therefore, you should not exceed more than 4000 mg of tylenol in a day if you do not have liver disease.  Also note that there are may OTC medicines, such as cold medicines and allergy medicines that my contain tylenol as well.  If you have any questions about medications and/or interactions please ask  your doctor/PA or your pharmacist.   PAIN MEDICATION USE AND EXPECTATIONS  You have likely been given narcotic medications to help control your pain.  After a traumatic event that results in an fracture (broken bone) with or without surgery, it is ok to use narcotic pain medications to help control one's pain.  We understand that everyone responds to pain differently and each individual patient will be evaluated on a regular basis for the continued need for narcotic medications. Ideally, narcotic medication use should last no more than 6-8 weeks (coinciding with fracture healing).   As a patient it is your responsibility as well to monitor narcotic medication use and report the amount and frequency you use these medications when you come to your office visit.   We would also advise that if you are using narcotic medications, you should take a dose prior to therapy to maximize you participation.  IF YOU ARE ON NARCOTIC MEDICATIONS IT IS NOT PERMISSIBLE TO OPERATE A MOTOR VEHICLE (MOTORCYCLE/CAR/TRUCK/MOPED) OR HEAVY MACHINERY DO NOT MIX NARCOTICS WITH OTHER CNS (CENTRAL NERVOUS SYSTEM) DEPRESSANTS SUCH AS ALCOHOL       ICE AND ELEVATE INJURED/OPERATIVE EXTREMITY  Using ice and elevating the injured extremity above your heart can help with swelling and pain control.  Icing in a pulsatile fashion, such as 20 minutes on and 20 minutes off, can be followed.    Do not place ice directly on skin. Make sure there is a barrier between to skin and the ice pack.    Using frozen items such as frozen peas works well as the conform nicely to the are that needs to be iced.  USE AN ACE WRAP OR TED HOSE FOR  SWELLING CONTROL  In addition to icing and elevation, Ace wraps or TED hose are used to help limit and resolve swelling.  It is recommended to use Ace wraps or TED hose until you are informed to stop.    When using Ace Wraps start the wrapping distally (farthest away from the body) and wrap proximally (closer  to the body)   Example: If you had surgery on your leg or thing and you do not have a splint on, start the ace wrap at the toes and work your way up to the thigh        If you had surgery on your upper extremity and do not have a splint on, start the ace wrap at your fingers and work your way up to the upper arm  IF YOU ARE IN A SPLINT OR CAST DO NOT REMOVE IT FOR ANY REASON   If your splint gets wet for any reason please contact the office immediately. You may shower in your splint or cast as long as you keep it dry.  This can be done by wrapping in a cast cover or garbage back (or similar)  Do Not stick any thing down your splint or cast such as pencils, money, or hangers to try and scratch yourself with.  If you feel itchy take benadryl as prescribed on the bottle for itching  IF YOU ARE IN A CAM BOOT (BLACK BOOT)  You may remove boot periodically. Perform daily dressing changes as noted below.  Wash the liner of the boot regularly and wear a sock when wearing the boot. It is recommended that you sleep in the boot until told otherwise  CALL THE OFFICE WITH ANY QUESTIONS OR CONCERTS: [phone]     Discharge Pin Site Instructions  Dress pins daily with Kerlix roll starting on POD 2. Wrap the Kerlix so that it tamps the skin down around the pin-skin interface to prevent/limit motion of the skin relative to the pin.  (Pin-skin motion is the primary cause of pain and infection related to external fixator pin sites).  Remove any crust or coagulum that may obstruct drainage with a saline moistened gauze or soap and water.  After POD 3, if there is no discernable drainage on the pin site dressing, the interval for change can by increased to every other day.  You may shower with the fixator, cleaning all pin sites gently with soap and water.  If you have a surgical wound this needs to be completely dry and without drainage before showering.  The extremity can be lifted by the fixator to  facilitate wound care and transfers.  Notify the office/Doctor if you experience increasing drainage, redness, or pain from a pin site, or if you notice purulent (thick, snot-like) drainage.  Discharge Wound Care Instructions  Do NOT apply any ointments, solutions or lotions to pin sites or surgical wounds.  These prevent needed drainage and even though solutions like hydrogen peroxide kill bacteria, they also damage cells lining the pin sites that help fight infection.  Applying lotions or ointments can keep the wounds moist and can cause them to breakdown and open up as well. This can increase the risk for infection. When in doubt call the office.  Surgical incisions should be dressed daily.  If any drainage is noted, use one layer of adaptic, then gauze, Kerlix, and an ace wrap.  Once the incision is completely dry and without drainage, it may be left open to air out.  Showering  may begin 36-48 hours later.  Cleaning gently with soap and water.  Traumatic wounds should be dressed daily as well.    One layer of adaptic, gauze, Kerlix, then ace wrap.  The adaptic can be discontinued once the draining has ceased    If you have a wet to dry dressing: wet the gauze with saline the squeeze as much saline out so the gauze is moist (not soaking wet), place moistened gauze over wound, then place a dry gauze over the moist one, followed by Kerlix wrap, then ace wrap.   What to eat:  For your first meals, you should eat lightly; only small meals initially.  If you do not have nausea, you may eat larger meals.  Avoid spicy, greasy and heavy food.    General Anesthesia, Adult, Care After  Refer to this sheet in the next few weeks. These instructions provide you with information on caring for yourself after your procedure. Your health care provider may also give you more specific instructions. Your treatment has been planned according to current medical practices, but problems sometimes occur. Call  your health care provider if you have any problems or questions after your procedure.  WHAT TO EXPECT AFTER THE PROCEDURE  After the procedure, it is typical to experience:  Sleepiness.  Nausea and vomiting. HOME CARE INSTRUCTIONS  For the first 24 hours after general anesthesia:  Have a responsible person with you.  Do not drive a car. If you are alone, do not take public transportation.  Do not drink alcohol.  Do not take medicine that has not been prescribed by your health care provider.  Do not sign important papers or make important decisions.  You may resume a normal diet and activities as directed by your health care provider.  Change bandages (dressings) as directed.  If you have questions or problems that seem related to general anesthesia, call the hospital and ask for the anesthetist or anesthesiologist on call. SEEK MEDICAL CARE IF:  You have nausea and vomiting that continue the day after anesthesia.  You develop a rash. SEEK IMMEDIATE MEDICAL CARE IF:  You have difficulty breathing.  You have chest pain.  You have any allergic problems. Document Released: 04/13/2000 Document Revised: 09/07/2012 Document Reviewed: 07/21/2012  [facility] Patient Information 2014 [employer], [place].

## 2014-01-05 NOTE — Anesthesia Procedure Notes (Addendum)
Anesthesia Regional Block:  Supraclavicular block  Pre-Anesthetic Checklist: ,, timeout performed, Correct Patient, Correct Site, Correct Laterality, Correct Procedure, Correct Position, site marked, Risks and benefits discussed,  Surgical consent,  Pre-op evaluation,  At surgeon's request and post-op pain management  Laterality: Right  Prep: chloraprep       Needles:  Injection technique: Single-shot  Needle Type: Echogenic Stimulator Needle     Needle Length: 5cm 5 cm Needle Gauge: 22 and 22 G    Additional Needles:  Procedures: ultrasound guided (picture in chart) and nerve stimulator Supraclavicular block  Nerve Stimulator or Paresthesia:  Response: biceps flexion, 0.45 mA,   Additional Responses:   Narrative:  Start time: 01/05/2014 8:21 AM End time: 01/05/2014 8:32 AM Injection made incrementally with aspirations every 5 mL.  Performed by: Personally  Anesthesiologist: HODIERNE, ADAM  Additional Notes: Functioning IV was confirmed and monitors were applied.  A 31m 22ga Arrow echogenic stimulator needle was used. Sterile prep and drape,hand hygiene and sterile gloves were used.  Negative aspiration and negative test dose prior to incremental administration of local anesthetic. The patient tolerated the procedure well.  Ultrasound guidance: relevent anatomy identified, needle position confirmed, local anesthetic spread visualized around nerve(s), vascular puncture avoided.  Image printed for medical record.    Procedure Name: Intubation Date/Time: 01/05/2014 9:47 AM Performed by: OSusa LofflerPre-anesthesia Checklist: Patient identified, Patient being monitored, Emergency Drugs available, Timeout performed and Suction available Patient Re-evaluated:Patient Re-evaluated prior to inductionOxygen Delivery Method: Circle system utilized Preoxygenation: Pre-oxygenation with 100% oxygen Intubation Type: IV induction Ventilation: Mask ventilation without  difficulty Laryngoscope Size: Mac and 3 Grade View: Grade I Tube type: Oral Tube size: 7.5 mm Number of attempts: 1 Airway Equipment and Method: Stylet and LTA kit utilized Placement Confirmation: ETT inserted through vocal cords under direct vision,  positive ETCO2 and breath sounds checked- equal and bilateral Secured at: 23 cm Tube secured with: Tape Dental Injury: Teeth and Oropharynx as per pre-operative assessment

## 2014-01-05 NOTE — Progress Notes (Signed)
Orthopedic Tech Progress Note Patient Details:  Richard GustinBobby Humphrey 07/07/1985 [id number]  Ortho Devices Type of Ortho Device: Arm sling Ortho Device/Splint Location: rue Ortho Device/Splint Interventions: Application   Richard Humphrey 01/05/2014, 1:04 PM

## 2014-01-05 NOTE — H&P (Signed)
[facility]uma [facility] H&P    Chief Complaint:  Symptomatic Hardware Right Elbow HPI:   28 y/o male with hx of distal humerus and olecranon fx of R elbow.  admited to hospital approximately 3 weeks ago for septic olecranon bursitis. This has been treated successfully with oral cipro. Cultures grew out MSSA. Pt presents today for removal of hardware as it has contributed to his chronic bursitis.   Pt is currently in a zero tolerance drug rehab facility and cannot receive any narcotics during hospitalization  Past Medical History  Diagnosis Date  . Elbow fracture, right     with surgery  . Asthma     children     Past Surgical History  Procedure Laterality Date  . Knee arthroscopy Left   . Hand surgery Right   . Tooth extraction    . Elbow fracture surgery Right 2006    Family History  Problem Relation Age of Onset  . Diabetes Mellitus II Neg Hx    Social History:  reports that he quit smoking about 2 months ago. His smoking use included Cigarettes. He smoked 0.00 packs per day. He does not have any smokeless tobacco history on file. He reports that he uses illicit drugs (Heroin and Other-see comments). He reports that he does not drink alcohol.  Allergies: No Known Allergies  Medications Prior to Admission  Medication Sig Dispense Refill  . amoxicillin-clavulanate (AUGMENTIN) 875-125 MG per tablet Take 1 tablet by mouth 2 (two) times daily. 42 tablet 0  . ciprofloxacin (CIPRO) 500 MG tablet Take 1 tablet (500 mg total) by mouth 2 (two) times daily. 42 tablet 0  . ibuprofen (ADVIL,MOTRIN) 200 MG tablet Take 400 mg by mouth 2 (two) times daily as needed for moderate pain (pain).       Results for orders placed or performed during the hospital encounter of 01/05/14 (from the past 48 hour(s))  CBC     Status: None   Collection Time: 01/05/14  7:30 AM  Result Value Ref Range   WBC 5.7 4.0 - 10.5 K/uL   RBC 4.47 4.22 - 5.81 MIL/uL   Hemoglobin 14.3 13.0 - 17.0 g/dL   HCT  78.241.6 95.639.0 - 21.352.0 %   MCV 93.1 78.0 - 100.0 fL   MCH 32.0 26.0 - 34.0 pg   MCHC 34.4 30.0 - 36.0 g/dL   RDW 08.611.5 57.811.5 - 46.915.5 %   Platelets 181 150 - 400 K/uL   No results found.  Review of Systems  Constitutional: Negative for fever and chills.  Respiratory: Negative for shortness of breath and wheezing.   Cardiovascular: Negative for chest pain and palpitations.  Gastrointestinal: Negative for nausea and vomiting.  Musculoskeletal:       R elbow swelling   Neurological: Negative for tingling, sensory change and headaches.    Blood pressure 120/80, pulse 73, temperature 98.6 F (37 C), temperature source Oral, resp. rate 16, height 6\' 2"  (1.88 m), weight 78.019 kg (172 lb), SpO2 100 %. Physical Exam  Constitutional: Vital signs are normal. He appears well-developed and well-nourished. He is active and cooperative.  HENT:  Head: Normocephalic and atraumatic.  Mouth/Throat: Mucous membranes are normal. Abnormal dentition.  Cardiovascular: Normal rate, regular rhythm and S1 normal.   Respiratory: Effort normal and breath sounds normal.  GI: Soft. Bowel sounds are normal. There is no tenderness.  Musculoskeletal:  Right Elbow   Swelling and erythema resolved from previous hospitalization    Hardware palpable   Motor and sensory  functions intact    Ext warm    + radial pulse   Neurological: He is alert.  Psychiatric: He has a normal mood and affect. His behavior is normal. Cognition and memory are normal.     Assessment/Plan  28 y/o male with retained hardware R olecranon  [facility] No narcotics as pt in zero tolerance drug treatment program  Follow up with ortho in 2 weeks No restrictions post op   Mearl LatinKeith W. Kyian Obst, PA-C [facility] [phone] (P) 01/05/2014, 8:16 AM

## 2014-01-05 NOTE — Transfer of Care (Signed)
Immediate Anesthesia Transfer of Care Note  Patient: Richard GustinBobby Humphrey  Procedure(s) Performed: Procedure(s): HARDWARE REMOVAL RIGHT ELBOW (Right)  Patient Location: PACU  Anesthesia Type:General  Level of Consciousness: awake, alert  and oriented  Airway & Oxygen Therapy: Patient Spontanous Breathing and Patient connected to nasal cannula oxygen  Post-op Assessment: Report given to PACU RN and Post -op Vital signs reviewed and stable  Post vital signs: Reviewed and stable  Complications: No apparent anesthesia complications

## 2014-01-05 NOTE — Brief Op Note (Signed)
.  01/05/2014  11:48 AM  PATIENT:  Richard Humphrey  28 y.o. male  PRE-OPERATIVE DIAGNOSIS:   1. SYMPTOMATIC HARDWARE RIGHT ULNA 2. CHRONIC OLECRANON BURSITIS  POST-OPERATIVE DIAGNOSIS:   1. SYMPTOMATIC HARDWARE RIGHT ULNA 2. CHRONIC OLECRANON BURSITIS  PROCEDURE:  Procedure(s): 1. HARDWARE REMOVAL RIGHT ELBOW (Right) 2. BURSECTOMY AND EXCISIONAL DEBRIDEMENT OF DRAINING SINUS  SURGEON:  Surgeon(s) and Role:    * Budd PalmerMichael H Myrikal Messmer, MD - Primary  PHYSICIAN ASSISTANT: Montez MoritaKeith Paul, PA-C  ANESTHESIA:   general  I/O:  Total I/O In: 1000 [I.V.:1000] Out: 150 [Blood:150]  SPECIMEN:  No Specimen  TOURNIQUET:  None  DICTATION: .Other Dictation: Dictation Number [id number]

## 2014-01-06 NOTE — Op Note (Signed)
[name]:  [name], [name]               ACCOUNT NO.:  [id number]  MEDICAL RECORD NO.:  [id number]  LOCATION:  [facility]                         FACILITY:  [facility]  PHYSICIAN:  Doralee AlbinoMichael H. Carola FrostHandy, M.D. DATE OF BIRTH:  [birth year]  DATE OF PROCEDURE:  01/05/2014 DATE OF DISCHARGE:  01/05/2014                              OPERATIVE REPORT   PREOPERATIVE DIAGNOSES: 1. Symptomatic hardware, right ulna. 2. Chronic olecranon bursitis.  POSTOPERATIVE DIAGNOSES: 1. Symptomatic hardware, right ulna. 2. Chronic olecranon bursitis.  PROCEDURES: 1. Removal of deep implant, right olecranon. 2. Bursectomy and incisional debridement of draining sinus, right     elbow.  SURGEON:  Doralee AlbinoMichael H. Carola FrostHandy, M.D.  ASSISTANT:  Mearl LatinKeith W Paul, PA-C.  ANESTHESIA:  General supplemented with regional block.  COMPLICATIONS:  None.  I/O:  1000 mL crystalloid/150 mL EBL.  TOURNIQUET:  None.  SPECIMENS:  None.  DISPOSITION:  To PACU.  CONDITION:  Stable.  BRIEF SUMMARY OF INDICATIONS OF PROCEDURE:  Richard Humphrey is a 28 year old right-hand dominant male with a past medical history notable for polytrauma, requiring him to undergo extensive surgeries on his right upper extremity including repair of an olecranon fracture and proximal humerus.  The patient also developed drug addiction problems and is in a rehab program that precludes the use of narcotics even in the perioperative period.  He was recently seen for management of a recurrent aseptic olecranon bursitis that complicated a longstanding chronic bursitis of his elbow.  Also, he had some prominence of the hardware and tenderness along his plate more distally.  This was treated with Cipro for methicillin-sensitive Staph and now he presents for excision of his draining sinus. Bursectomy and removal of the hardware as well.  I did discuss with him extensively the risks that could include persistent infection, nerve injury, vessel injury, pain that required  narcotics and many others.  The patient understood these risks and did wish to proceed.  BRIEF SUMMARY OF PROCEDURE:  Richard Humphrey was taken to the operating room where general anesthesia was induced.  He was positioned right side up with his elbow on a foam block.  Following sterile prep and drape, the old incision was remade distally over the implant, but extended approximately over the central bursa and I excised a 1-cm tract that involved a draining sinus track as well as an area that appeared to be a previous tract.  I initially carried my dissection down to the plate. All the screws were identified and then carefully engaged with screw driver and withdrawn.  In spite of this, we had fracture of the long lag screw from the tip of the olecranon into the volar cortex of the ulna and also one in the midshaft that stripped, it could not be removed as well.  As a result of this, the Easy Out broken hardware removal set needed to be used and series of osteotomes, curettes, trephines and other devices engaged including the needle-nose vice grips and others and this at least doubled the operative time.  Ultimately, we were successful in removing them.  I then probed the course of the olecranon screw and found it did not extend into the joint of the elbow.  Wounds were irrigated thoroughly.  Also, used the Metzenbaum scissors to isolate and define the extent of the chronic bursa and this was excised en bloc back to healthy tendon underneath and subcutaneous tissue more superficially.  Montez MoritaKeith Paul, PA-C assisted me throughout and was necessary to stabilize the arm and provide retraction and protects the ulnar nerve during attempts at hardware removal as well as bursectomy.  He did assist with wound closure as well using PDS and 3-0 nylon.  Sterile gently compressive dressing was applied, then a sling.  The patient was awakened from anesthesia and transported to the PACU in stable  condition.  PROGNOSIS:  Richard Humphrey will continue his antibiotics for the next 10 days.  He will take Toradol for pain control and ice aggressively in hopes of avoiding all narcotics.  He did receive the supraclavicular block and we are hopeful that this will provide sufficient relief for him to maintain compliance with his addiction program.  I will plan to see him back in the office for removal of sutures in 10-14 days.  He will have no motion restrictions other than avoiding the extremes.     Doralee AlbinoMichael H. Carola FrostHandy, M.D.     [facility]MED[facility]  D:  01/05/2014  T:  01/06/2014  Job:  [id number]

## 2014-01-08 ENCOUNTER — Encounter (HOSPITAL_COMMUNITY): Payer: Self-pay | Admitting: Orthopedic Surgery

## 2014-01-15 NOTE — Anesthesia Postprocedure Evaluation (Signed)
Anesthesia Post Note  Patient: Richard GustinBobby Humphrey  Procedure(s) Performed: Procedure(s) (LRB): HARDWARE REMOVAL RIGHT ELBOW (Right)  Anesthesia type: General  Patient location: PACU  Post pain: Pain level controlled and Adequate analgesia  Post assessment: Post-op Vital signs reviewed, Patient's Cardiovascular Status Stable, Respiratory Function Stable, Patent Airway and Pain level controlled  Last Vitals:  Filed Vitals:   01/05/14 1245  BP: 116/68  Pulse: 78  Temp:   Resp: 15    Post vital signs: Reviewed and stable  Level of consciousness: awake, alert  and oriented  Complications: No apparent anesthesia complications

## 2014-01-26 ENCOUNTER — Ambulatory Visit: Payer: Self-pay | Attending: Internal Medicine

## 2014-03-29 ENCOUNTER — Encounter: Payer: Self-pay | Admitting: Internal Medicine

## 2014-03-29 ENCOUNTER — Ambulatory Visit: Payer: Self-pay | Attending: Internal Medicine | Admitting: Internal Medicine

## 2014-03-29 VITALS — BP 126/82 | HR 78 | Temp 98.0°F | Resp 16 | Wt 183.0 lb

## 2014-03-29 DIAGNOSIS — Z792 Long term (current) use of antibiotics: Secondary | ICD-10-CM | POA: Insufficient documentation

## 2014-03-29 DIAGNOSIS — L539 Erythematous condition, unspecified: Secondary | ICD-10-CM | POA: Insufficient documentation

## 2014-03-29 DIAGNOSIS — Z87891 Personal history of nicotine dependence: Secondary | ICD-10-CM | POA: Insufficient documentation

## 2014-03-29 DIAGNOSIS — M25421 Effusion, right elbow: Secondary | ICD-10-CM | POA: Insufficient documentation

## 2014-03-29 DIAGNOSIS — E559 Vitamin D deficiency, unspecified: Secondary | ICD-10-CM | POA: Insufficient documentation

## 2014-03-29 DIAGNOSIS — K029 Dental caries, unspecified: Secondary | ICD-10-CM | POA: Insufficient documentation

## 2014-03-29 DIAGNOSIS — Z87898 Personal history of other specified conditions: Secondary | ICD-10-CM | POA: Insufficient documentation

## 2014-03-29 DIAGNOSIS — Z8781 Personal history of (healed) traumatic fracture: Secondary | ICD-10-CM | POA: Insufficient documentation

## 2014-03-29 NOTE — Progress Notes (Signed)
MRN: [id number] Name: Richard Humphrey  Sex: male Age: 29 y.o. DOB: Mar 23, 1985  Allergies: Review of patient's allergies indicates no known allergies.  Chief Complaint  Patient presents with  . dental referral    HPI: Patient is 29 y.o. male who has history of polysubstance abuse, history of right elbow surgery currently following up with orthopedics denies any fever chills or any pain, today his major concern is dental cavities and is requesting referral to see a dentist.previous blood work reviewed with the patient her noted low vitamin D.  Past Medical History  Diagnosis Date  . Elbow fracture, right     with surgery  . Asthma     children     Past Surgical History  Procedure Laterality Date  . Knee arthroscopy Left   . Hand surgery Right   . Tooth extraction    . Elbow fracture surgery Right 2006  . Hardware removal Right 01/05/2014    Procedure: HARDWARE REMOVAL RIGHT ELBOW;  Surgeon: Budd PalmerMichael H Handy, MD;  [facility];  Service: Orthopedics;  Laterality: Right;      Medication List       This list is accurate as of: 03/29/14 12:38 PM.  Always use your most recent med list.               ciprofloxacin 500 MG tablet  Commonly known as:  CIPRO  Take 1 tablet (500 mg total) by mouth 2 (two) times daily.     ibuprofen 200 MG tablet  Commonly known as:  ADVIL,MOTRIN  Take 3 tablets (600 mg total) by mouth every 8 (eight) hours as needed for moderate pain (pain).     ketorolac 10 MG tablet  Commonly known as:  TORADOL  Take 1 tablet (10 mg total) by mouth every 6 (six) hours as needed for moderate pain.     methocarbamol 500 MG tablet  Commonly known as:  ROBAXIN  Take 1-2 tablets (500-1,000 mg total) by mouth 4 (four) times daily.        No orders of the defined types were placed in this encounter.    Immunization History  Administered Date(s) Administered  . Tdap 12/25/2013    Family History  Problem Relation Age of Onset  . Diabetes  Mellitus II Neg Hx     History  Substance Use Topics  . Smoking status: Former Smoker    Types: Cigarettes    Quit date: 10/06/2013  . Smokeless tobacco: Not on file  . Alcohol Use: No    Review of Systems   As noted in HPI  Filed Vitals:   03/29/14 1143  BP: 126/82  Pulse: 78  Temp: 98 F (36.7 C)  Resp: 16    Physical Exam  Physical Exam  HENT:  Dental cavities  Eyes: EOM are normal. Pupils are equal, round, and reactive to light.  Cardiovascular: Normal rate and regular rhythm.   Pulmonary/Chest: Breath sounds normal. No respiratory distress. He has no wheezes. He has no rales.  Musculoskeletal:  right elbow erythema minimal swelling no apparent discharge no tenderness.      CBC    Component Value Date/Time   WBC 5.7 01/05/2014 0730   RBC 4.47 01/05/2014 0730   RBC 3.97* 12/15/2013 0424   HGB 14.3 01/05/2014 0730   HCT 41.6 01/05/2014 0730   PLT 181 01/05/2014 0730   MCV 93.1 01/05/2014 0730   LYMPHSABS 2.6 12/14/2013 2007   MONOABS 1.5* 12/14/2013 2007   EOSABS 0.0  12/14/2013 2007   BASOSABS 0.0 12/14/2013 2007    CMP     Component Value Date/Time   NA 140 12/25/2013 1200   K 5.1 12/25/2013 1200   CL 101 12/25/2013 1200   CO2 28 12/25/2013 1200   GLUCOSE 87 12/25/2013 1200   BUN 14 12/25/2013 1200   CREATININE 1.09 12/25/2013 1200   CREATININE 1.07 12/15/2013 0424   CALCIUM 10.2 12/25/2013 1200   PROT 7.6 12/25/2013 1200   ALBUMIN 4.4 12/25/2013 1200   AST 22 12/25/2013 1200   ALT 20 12/25/2013 1200   ALKPHOS 54 12/25/2013 1200   BILITOT 0.3 12/25/2013 1200   GFRNONAA >89 12/25/2013 1200   GFRNONAA >90 12/15/2013 0424   GFRAA >89 12/25/2013 1200   GFRAA >90 12/15/2013 0424    Lab Results  Component Value Date/Time   CHOL 142 12/25/2013 12:00 PM    No components found for: HGA1C  Lab Results  Component Value Date/Time   AST 22 12/25/2013 12:00 PM    Assessment and Plan  Dental cavities - Plan: Ambulatory referral to  Dentistry  Vitamin D insufficiency Advise patient to start taking over-the-counter vitamin D 2000 units daily  Health Maintenance  -Vaccinations:  Patient declines flu shot.  Return in about 6 months (around 09/29/2014), or if symptoms worsen or fail to improve.   This note has been created with Education officer, environmental. Any transcriptional errors are unintentional.    Doris Cheadle, MD

## 2014-03-29 NOTE — Progress Notes (Signed)
Patient here for dental referral Had some surgery to his right elbow and now having some swelling

## 2014-08-01 ENCOUNTER — Encounter: Payer: Self-pay | Admitting: Internal Medicine

## 2014-08-01 ENCOUNTER — Ambulatory Visit: Payer: Self-pay | Attending: Internal Medicine | Admitting: Internal Medicine

## 2014-08-01 VITALS — BP 130/82 | HR 76 | Temp 97.0°F | Resp 16

## 2014-08-01 DIAGNOSIS — R21 Rash and other nonspecific skin eruption: Secondary | ICD-10-CM | POA: Insufficient documentation

## 2014-08-01 DIAGNOSIS — Z79899 Other long term (current) drug therapy: Secondary | ICD-10-CM | POA: Insufficient documentation

## 2014-08-01 DIAGNOSIS — Z87891 Personal history of nicotine dependence: Secondary | ICD-10-CM | POA: Insufficient documentation

## 2014-08-01 MED ORDER — CLOTRIMAZOLE-BETAMETHASONE 1-0.05 % EX CREA: 1.0000 "application " | TOPICAL_CREAM | Freq: Two times a day (BID) | CUTANEOUS | Status: AC

## 2014-08-01 NOTE — Progress Notes (Signed)
Patient complains of having a rash in his groin area Patient states it is extremely itchy and at times he feels Like something is crawling around in that area

## 2014-08-01 NOTE — Progress Notes (Signed)
MRN: [id number] Name: Richard Humphrey  Sex: male Age: 29 y.o. DOB: May 02, 1985  Allergies: Review of patient's allergies indicates no known allergies.  Chief Complaint  Patient presents with  . Rash    HPI: Patient is 29 y.o. male who comes today complaining of groin rash for the last several days, patient has not tried any over-the-counter cream, denies any fever chills denies any urinary symptoms denies any penile discharge.  Past Medical History  Diagnosis Date  . Elbow fracture, right     with surgery  . Asthma     children     Past Surgical History  Procedure Laterality Date  . Knee arthroscopy Left   . Hand surgery Right   . Tooth extraction    . Elbow fracture surgery Right 2006  . Hardware removal Right 01/05/2014    Procedure: HARDWARE REMOVAL RIGHT ELBOW;  Surgeon: Budd Palmer, MD;  Location: [facility] OR;  Service: Orthopedics;  Laterality: Right;      Medication List       This list is accurate as of: 08/01/14  4:21 PM.  Always use your most recent med list.               ciprofloxacin 500 MG tablet  Commonly known as:  CIPRO  Take 1 tablet (500 mg total) by mouth 2 (two) times daily.     clotrimazole-betamethasone cream  Commonly known as:  LOTRISONE  Apply 1 application topically 2 (two) times daily.     ibuprofen 200 MG tablet  Commonly known as:  ADVIL,MOTRIN  Take 3 tablets (600 mg total) by mouth every 8 (eight) hours as needed for moderate pain (pain).     ketorolac 10 MG tablet  Commonly known as:  TORADOL  Take 1 tablet (10 mg total) by mouth every 6 (six) hours as needed for moderate pain.     methocarbamol 500 MG tablet  Commonly known as:  ROBAXIN  Take 1-2 tablets (500-1,000 mg total) by mouth 4 (four) times daily.        Meds ordered this encounter  Medications  . clotrimazole-betamethasone (LOTRISONE) cream    Sig: Apply 1 application topically 2 (two) times daily.    Dispense:  30 g    Refill:  1    Immunization  History  Administered Date(s) Administered  . Tdap 12/25/2013    Family History  Problem Relation Age of Onset  . Diabetes Mellitus II Neg Hx     History  Substance Use Topics  . Smoking status: Former Smoker    Types: Cigarettes    Quit date: 10/06/2013  . Smokeless tobacco: Not on file  . Alcohol Use: No    Review of Systems   As noted in HPI  Filed Vitals:   08/01/14 1446  BP: 130/82  Pulse: 76  Temp: 97 F (36.1 C)  Resp: 16    Physical Exam  Physical Exam  Constitutional: No distress.  Eyes: EOM are normal. Pupils are equal, round, and reactive to light.  Cardiovascular: Normal rate.   Pulmonary/Chest: Breath sounds normal. No respiratory distress. He has no wheezes. He has no rales.  Genitourinary: No discharge found.  Rash in the groin area bilaterally, no lymphadenopathy    CBC    Component Value Date/Time   WBC 5.7 01/05/2014 0730   RBC 4.47 01/05/2014 0730   RBC 3.97* 12/15/2013 0424   HGB 14.3 01/05/2014 0730   HCT 41.6 01/05/2014 0730   PLT 181 01/05/2014  0730   MCV 93.1 01/05/2014 0730   LYMPHSABS 2.6 12/14/2013 2007   MONOABS 1.5* 12/14/2013 2007   EOSABS 0.0 12/14/2013 2007   BASOSABS 0.0 12/14/2013 2007    CMP     Component Value Date/Time   NA 140 12/25/2013 1200   K 5.1 12/25/2013 1200   CL 101 12/25/2013 1200   CO2 28 12/25/2013 1200   GLUCOSE 87 12/25/2013 1200   BUN 14 12/25/2013 1200   CREATININE 1.09 12/25/2013 1200   CREATININE 1.07 12/15/2013 0424   CALCIUM 10.2 12/25/2013 1200   PROT 7.6 12/25/2013 1200   ALBUMIN 4.4 12/25/2013 1200   AST 22 12/25/2013 1200   ALT 20 12/25/2013 1200   ALKPHOS 54 12/25/2013 1200   BILITOT 0.3 12/25/2013 1200   GFRNONAA >89 12/25/2013 1200   GFRNONAA >90 12/15/2013 0424   GFRAA >89 12/25/2013 1200   GFRAA >90 12/15/2013 0424    Lab Results  Component Value Date/Time   CHOL 142 12/25/2013 12:00 PM    Lab Results  Component Value Date/Time   HGBA1C 5.3 12/25/2013 12:00 PM      Lab Results  Component Value Date/Time   AST 22 12/25/2013 12:00 PM    Assessment and Plan  Groin rash - Plan: prescribed clotrimazole-betamethasone (LOTRISONE) cream   Return in about 3 months (around 11/01/2014), or if symptoms worsen or fail to improve.   This note has been created with Education officer, environmentalDragon speech recognition software and smart phrase technology. Any transcriptional errors are unintentional.    Doris CheadleADVANI, Jania Steinke, MD

## 2014-08-24 ENCOUNTER — Ambulatory Visit: Payer: Self-pay

## 2014-08-29 ENCOUNTER — Ambulatory Visit: Payer: Self-pay | Attending: Internal Medicine

## 2014-08-29 ENCOUNTER — Ambulatory Visit: Payer: Self-pay

## 2014-10-04 ENCOUNTER — Encounter (HOSPITAL_COMMUNITY): Payer: Self-pay | Admitting: Emergency Medicine

## 2014-10-04 ENCOUNTER — Emergency Department (HOSPITAL_COMMUNITY)
Admission: EM | Admit: 2014-10-04 | Discharge: 2014-10-04 | Disposition: A | Discharge status: Home / Self Care | Payer: Self-pay | Attending: Emergency Medicine | Admitting: Emergency Medicine

## 2014-10-04 DIAGNOSIS — N492 Inflammatory disorders of scrotum: Secondary | ICD-10-CM | POA: Insufficient documentation

## 2014-10-04 DIAGNOSIS — Z87828 Personal history of other (healed) physical injury and trauma: Secondary | ICD-10-CM | POA: Insufficient documentation

## 2014-10-04 DIAGNOSIS — Z87891 Personal history of nicotine dependence: Secondary | ICD-10-CM | POA: Insufficient documentation

## 2014-10-04 DIAGNOSIS — J45909 Unspecified asthma, uncomplicated: Secondary | ICD-10-CM | POA: Insufficient documentation

## 2014-10-04 MED ORDER — CEPHALEXIN 500 MG PO CAPS
500.0000 mg | ORAL_CAPSULE | Freq: Once | ORAL | Status: AC
  Administered 2014-10-04: 500 mg via ORAL
  Filled 2014-10-04: qty 1

## 2014-10-04 MED ORDER — IBUPROFEN 600 MG PO TABS: 600.0000 mg | ORAL_TABLET | Freq: Four times a day (QID) | ORAL | Status: AC | PRN

## 2014-10-04 MED ORDER — CEPHALEXIN 500 MG PO CAPS: 500.0000 mg | ORAL_CAPSULE | Freq: Four times a day (QID) | ORAL | Status: AC

## 2014-10-04 MED ORDER — SULFAMETHOXAZOLE-TRIMETHOPRIM 800-160 MG PO TABS
1.0000 | ORAL_TABLET | Freq: Once | ORAL | Status: AC
  Administered 2014-10-04: 1 via ORAL
  Filled 2014-10-04: qty 1

## 2014-10-04 MED ORDER — SULFAMETHOXAZOLE-TRIMETHOPRIM 800-160 MG PO TABS: 1.0000 | ORAL_TABLET | Freq: Two times a day (BID) | ORAL | Status: AC

## 2014-10-04 NOTE — ED Provider Notes (Signed)
CSN: [id number]     Arrival date & time 10/04/14  0435 History   First MD Initiated Contact with Patient 10/04/14 0550     Chief Complaint  Patient presents with  . Testicle Pain     (Consider location/radiation/quality/duration/timing/severity/associated sxs/prior Treatment) HPI Patient presents with scrotal wall pain and swelling. Denies any penile discharge or dysuria. States there is been some purulent drainage from the scrotal wound. He's had no fever or chills. Denies any testicular swelling or pain. No abdominal pain. No nausea, vomiting or diarrhea. Past Medical History  Diagnosis Date  . Elbow fracture, right     with surgery  . Asthma     children    Past Surgical History  Procedure Laterality Date  . Knee arthroscopy Left   . Hand surgery Right   . Tooth extraction    . Elbow fracture surgery Right 2006  . Hardware removal Right 01/05/2014    Procedure: HARDWARE REMOVAL RIGHT ELBOW;  Surgeon: Budd Palmer, MD;  Location: [facility] OR;  Service: Orthopedics;  Laterality: Right;   Family History  Problem Relation Age of Onset  . Diabetes Mellitus II Neg Hx    Social History  Substance Use Topics  . Smoking status: Former Smoker    Types: Cigarettes    Quit date: 10/06/2013  . Smokeless tobacco: None  . Alcohol Use: No    Review of Systems  Constitutional: Negative for fever and chills.  Respiratory: Negative for shortness of breath.   Cardiovascular: Negative for chest pain.  Gastrointestinal: Negative for nausea, vomiting, abdominal pain and diarrhea.  Genitourinary: Positive for scrotal swelling. Negative for dysuria, penile swelling, penile pain and testicular pain.  All other systems reviewed and are negative.     Allergies  Review of patient's allergies indicates no known allergies.  Home Medications   Prior to Admission medications   Medication Sig Start Date End Date Taking? Authorizing Provider  naproxen sodium (ANAPROX) 220 MG tablet Take 440  mg by mouth 2 (two) times daily as needed (pain).   Yes Historical Provider, MD  cephALEXin (KEFLEX) 500 MG capsule Take 1 capsule (500 mg total) by mouth 4 (four) times daily. 10/04/14   Loren Racer, MD  ciprofloxacin (CIPRO) 500 MG tablet Take 1 tablet (500 mg total) by mouth 2 (two) times daily. Patient not taking: Reported on 10/04/2014 01/05/14   Montez Morita, PA-C  clotrimazole-betamethasone (LOTRISONE) cream Apply 1 application topically 2 (two) times daily. Patient not taking: Reported on 10/04/2014 08/01/14   Doris Cheadle, MD  ibuprofen (ADVIL,MOTRIN) 600 MG tablet Take 1 tablet (600 mg total) by mouth every 6 (six) hours as needed for moderate pain (pain). 10/04/14   Loren Racer, MD  ketorolac (TORADOL) 10 MG tablet Take 1 tablet (10 mg total) by mouth every 6 (six) hours as needed for moderate pain. Patient not taking: Reported on 10/04/2014 01/05/14   Montez Morita, PA-C  methocarbamol (ROBAXIN) 500 MG tablet Take 1-2 tablets (500-1,000 mg total) by mouth 4 (four) times daily. Patient not taking: Reported on 10/04/2014 01/05/14   Montez Morita, PA-C  sulfamethoxazole-trimethoprim (BACTRIM DS,SEPTRA DS) 800-160 MG per tablet Take 1 tablet by mouth 2 (two) times daily. 10/04/14 10/11/14  Loren Racer, MD   BP 133/83 mmHg  Pulse 81  Temp(Src) 98.4 F (36.9 C) (Oral)  Resp 18  Ht 6\' 2"  (1.88 m)  Wt 181 lb (82.101 kg)  BMI 23.23 kg/m2  SpO2 100% Physical Exam  Constitutional: He is oriented to person, place,  and time. He appears well-developed and well-nourished. No distress.  HENT:  Head: Normocephalic and atraumatic.  Mouth/Throat: Oropharynx is clear and moist.  Eyes: EOM are normal. Pupils are equal, round, and reactive to light.  Neck: Normal range of motion. Neck supple.  Cardiovascular: Normal rate and regular rhythm.   Pulmonary/Chest: Effort normal and breath sounds normal. No respiratory distress. He has no wheezes. He has no rales.  Abdominal: Soft. Bowel sounds are  normal. He exhibits no distension and no mass. There is no tenderness. There is no rebound and no guarding.  Genitourinary:  Patient with bilateral cremasteric reflexes. Testicles in normal lie. No testicular tenderness. No inguinal hernias present. No penile discharge. Patient does have indurated, erythematous and warm skin to the left scrotal wall. No fluctuant masses appreciated.  Musculoskeletal: Normal range of motion. He exhibits no edema or tenderness.  Neurological: He is alert and oriented to person, place, and time.  Skin: Skin is warm and dry. No rash noted. No erythema.  Psychiatric: He has a normal mood and affect. His behavior is normal.  Nursing note and vitals reviewed.   ED Course  Procedures (including critical care time) Labs Review Labs Reviewed - No data to display  Imaging Review No results found. I have personally reviewed and evaluated these images and lab results as part of my medical decision-making.   EKG Interpretation None      MDM   Final diagnoses:  Cellulitis of scrotum    We'll treat for scrotal wall cellulitis. No testicular tenderness, masses. Patient is advised to return to the emergency department in 2 days or to follow up with his primary physician to re-examine the scrotum and assure improvement. Return precautions have been given    Loren Racer, MD 10/04/14 [phone]

## 2014-10-04 NOTE — Discharge Instructions (Signed)

## 2014-10-04 NOTE — ED Notes (Signed)
Pt states he has a knot on his left groin/scrotal area  Pt states it started off small and now it has grown and is about the size of a golf ball  Pt states it hurts every now and then but not constant

## 2014-10-04 NOTE — ED Notes (Signed)
Patient with "knot" to his left testicle, the area is red. Patient states he was "messing with it" and some drainage came out of it. MD at bedside for exam

## 2015-11-27 ENCOUNTER — Encounter (HOSPITAL_COMMUNITY): Payer: Self-pay | Admitting: Emergency Medicine

## 2015-11-27 ENCOUNTER — Emergency Department (HOSPITAL_COMMUNITY)
Admission: EM | Admit: 2015-11-27 | Discharge: 2015-11-27 | Disposition: A | Discharge status: Home / Self Care | Payer: PRIVATE HEALTH INSURANCE | Attending: Emergency Medicine | Admitting: Emergency Medicine

## 2015-11-27 DIAGNOSIS — J45909 Unspecified asthma, uncomplicated: Secondary | ICD-10-CM | POA: Insufficient documentation

## 2015-11-27 DIAGNOSIS — Z79899 Other long term (current) drug therapy: Secondary | ICD-10-CM | POA: Diagnosis not present

## 2015-11-27 DIAGNOSIS — J340 Abscess, furuncle and carbuncle of nose: Secondary | ICD-10-CM | POA: Insufficient documentation

## 2015-11-27 DIAGNOSIS — F17210 Nicotine dependence, cigarettes, uncomplicated: Secondary | ICD-10-CM | POA: Diagnosis not present

## 2015-11-27 DIAGNOSIS — L03211 Cellulitis of face: Secondary | ICD-10-CM

## 2015-11-27 DIAGNOSIS — J3489 Other specified disorders of nose and nasal sinuses: Secondary | ICD-10-CM | POA: Diagnosis present

## 2015-11-27 DIAGNOSIS — Z791 Long term (current) use of non-steroidal anti-inflammatories (NSAID): Secondary | ICD-10-CM | POA: Diagnosis not present

## 2015-11-27 MED ORDER — MUPIROCIN CALCIUM 2 % NA OINT: TOPICAL_OINTMENT | NASAL | 1 refills | Status: AC

## 2015-11-27 MED ORDER — SULFAMETHOXAZOLE-TRIMETHOPRIM 800-160 MG PO TABS: 1.0000 | ORAL_TABLET | Freq: Two times a day (BID) | ORAL | 0 refills | Status: AC

## 2015-11-27 MED ORDER — NAPROXEN 250 MG PO TABS: 250.0000 mg | ORAL_TABLET | Freq: Two times a day (BID) | ORAL | 0 refills | Status: AC

## 2015-11-27 NOTE — ED Triage Notes (Signed)
Pt c/o abscess to the left nare for a couple of days. Pt states he busted it yesterday.

## 2015-11-27 NOTE — ED Provider Notes (Signed)
[facility]-EMERGENCY DEPT Provider Note   CSN: [id number] Arrival date & time: 11/27/15  [id number]     History   Chief Complaint Chief Complaint  Patient presents with  . Abscess    HPI Richard Humphrey is a 30 y.o. male.  Richard Humphrey is a 30 y.o. Male who presents to the ED complaining of an abscess-like lesion to his left nostril for the past 2 days. Patient reports he noticed this bump about 2 days ago. He reports earlier today he began plucking the nose hairs out of his nostril thinking it was an ingrown hair.. Reports having some puslike discharge from the site. He reports pain to the site. No previous abscesses to this area. No fevers. No treatments prior to arrival. He denies fevers, facial swelling, mouth pain, dental pain, or other complaints.   The history is provided by the patient. No language interpreter was used.  Abscess  Associated symptoms: no fever and no headaches     Past Medical History:  Diagnosis Date  . Asthma    children   . Elbow fracture, right    with surgery    Patient Active Problem List   Diagnosis Date Noted  . History of substance abuse 12/25/2013  . Cellulitis 12/14/2013  . Cellulitis of elbow 12/14/2013  . Normocytic anemia 12/14/2013    Past Surgical History:  Procedure Laterality Date  . ELBOW FRACTURE SURGERY Right 2006  . HAND SURGERY Right   . HARDWARE REMOVAL Right 01/05/2014   Procedure: HARDWARE REMOVAL RIGHT ELBOW;  Surgeon: Budd PalmerMichael H Handy, MD;  [facility];  Service: Orthopedics;  Laterality: Right;  . KNEE ARTHROSCOPY Left   . TOOTH EXTRACTION         Home Medications    Prior to Admission medications   Medication Sig Start Date End Date Taking? Authorizing Provider  cephALEXin (KEFLEX) 500 MG capsule Take 1 capsule (500 mg total) by mouth 4 (four) times daily. 10/04/14   Loren Raceravid Yelverton, MD  ciprofloxacin (CIPRO) 500 MG tablet Take 1 tablet (500 mg total) by mouth 2 (two) times daily. Patient not taking: Reported on  10/04/2014 01/05/14   Montez MoritaKeith Paul, PA-C  clotrimazole-betamethasone (LOTRISONE) cream Apply 1 application topically 2 (two) times daily. Patient not taking: Reported on 10/04/2014 08/01/14   Doris Cheadleeepak Advani, MD  ibuprofen (ADVIL,MOTRIN) 600 MG tablet Take 1 tablet (600 mg total) by mouth every 6 (six) hours as needed for moderate pain (pain). 10/04/14   Loren Raceravid Yelverton, MD  ketorolac (TORADOL) 10 MG tablet Take 1 tablet (10 mg total) by mouth every 6 (six) hours as needed for moderate pain. Patient not taking: Reported on 10/04/2014 01/05/14   Montez MoritaKeith Paul, PA-C  methocarbamol (ROBAXIN) 500 MG tablet Take 1-2 tablets (500-1,000 mg total) by mouth 4 (four) times daily. Patient not taking: Reported on 10/04/2014 01/05/14   Montez MoritaKeith Paul, PA-C  mupirocin nasal ointment (BACTROBAN) 2 % Apply in each nostril daily 11/27/15   Everlene FarrierWilliam Kit Brubacher, PA-C  naproxen (NAPROSYN) 250 MG tablet Take 1 tablet (250 mg total) by mouth 2 (two) times daily with a meal. 11/27/15   Everlene FarrierWilliam Piper [place]sebrock, PA-C  naproxen sodium (ANAPROX) 220 MG tablet Take 440 mg by mouth 2 (two) times daily as needed (pain).    Historical Provider, MD  sulfamethoxazole-trimethoprim (BACTRIM DS,SEPTRA DS) 800-160 MG tablet Take 1 tablet by mouth 2 (two) times daily. 11/27/15 12/04/15  Everlene FarrierWilliam Atasha Colebank, PA-C    Family History Family History  Problem Relation Age of Onset  . Diabetes Mellitus  II Neg Hx     Social History Social History  Substance Use Topics  . Smoking status: Current Every Day Smoker    Types: Cigarettes    Last attempt to quit: 10/06/2013  . Smokeless tobacco: Never Used  . Alcohol use No     Allergies   Patient has no known allergies.   Review of Systems Review of Systems  Constitutional: Negative for fever.  HENT: Negative for dental problem, ear pain, facial swelling, mouth sores, rhinorrhea, sore throat and trouble swallowing.   Skin: Positive for color change.  Neurological: Negative for headaches.     Physical  Exam Updated Vital Signs BP 117/75 (BP Location: Left Arm)   Pulse 76   Temp 98.2 F (36.8 C) (Oral)   Resp 18   Ht 6\' 2"  (1.88 m)   Wt 77.1 kg   SpO2 100%   BMI 21.83 kg/m   Physical Exam  Constitutional: He appears well-developed and well-nourished. No distress.  Nontoxic appearing.  HENT:  Head: Normocephalic and atraumatic.  Right Ear: External ear normal.  Left Ear: External ear normal.  Mouth/Throat: Oropharynx is clear and moist.  Small <0.5 cm raised area to his left nostril that appears to have had some drainage. There is overlying erythema, but no surrounding erythema. No fluctuance or drainage currently. No facial swelling. Throat is clear.   Eyes: Conjunctivae are normal. Pupils are equal, round, and reactive to light. Right eye exhibits no discharge. Left eye exhibits no discharge.  Neck: Neck supple.  Pulmonary/Chest: Effort normal. No respiratory distress.  Lymphadenopathy:    He has no cervical adenopathy.  Neurological: He is alert. Coordination normal.  Skin: No rash noted. He is not diaphoretic.  Psychiatric: He has a normal mood and affect. His behavior is normal.  Nursing note and vitals reviewed.    ED Treatments / Results  Labs (all labs ordered are listed, but only abnormal results are displayed) Labs Reviewed - No data to display  EKG  EKG Interpretation None       Radiology No results found.  Procedures Procedures (including critical care time)  Medications Ordered in ED Medications - No data to display   Initial Impression / Assessment and Plan / ED Course  I have reviewed the triage vital signs and the nursing notes.  Pertinent labs & imaging results that were available during my care of the patient were reviewed by me and considered in my medical decision making (see chart for details).  Clinical Course    This is a 30 y.o. Male who presents to the ED complaining of an abscess-like lesion to his left nostril for the past 2  days. Patient reports he noticed this bump about 2 days ago. He reports earlier today he began plucking the nose hairs out of his nostril thinking it was an ingrown hair.. Reports having some puslike discharge from the site. He reports pain to the site. No previous abscesses to this area. No fevers. On exam the patient is afebrile nontoxic appearing. He does have a small less than 0.5 cm raised area to his left nostril. It appears to have had some drainage. No active drainage. No fluctuance. There is overlying erythema but no surrounding erythema. No facial swelling noted. As this is on his face and there is some overlying erythema will aggressively treat this with Bactroban ointment, oral Bactrim and naproxen for pain control. There is no obvious abscess to drain at this point.  I advised the patient to  follow-up with their primary care provider this week. I advised the patient to return to the emergency department with new or worsening symptoms or new concerns. The patient verbalized understanding and agreement with plan.      Final Clinical Impressions(s) / ED Diagnoses   Final diagnoses:  Cellulitis of face    New Prescriptions New Prescriptions   MUPIROCIN NASAL OINTMENT (BACTROBAN) 2 %    Apply in each nostril daily   NAPROXEN (NAPROSYN) 250 MG TABLET    Take 1 tablet (250 mg total) by mouth 2 (two) times daily with a meal.   SULFAMETHOXAZOLE-TRIMETHOPRIM (BACTRIM DS,SEPTRA DS) 800-160 MG TABLET    Take 1 tablet by mouth 2 (two) times daily.     Everlene FarrierWilliam Demita Tobia, PA-C 11/27/15 2005    Derwood KaplanAnkit Nanavati, MD 11/28/15 1326

## 2016-05-27 IMAGING — CR DG ELBOW COMPLETE 3+V*R*
4 series · 4 of 4 positions shown · non-contrast
Comparison: None.

CLINICAL DATA: Redness and swelling for 4 days

EXAM:
RIGHT ELBOW - COMPLETE 3+ VIEW

[x elbow obl right (1 of 2)]
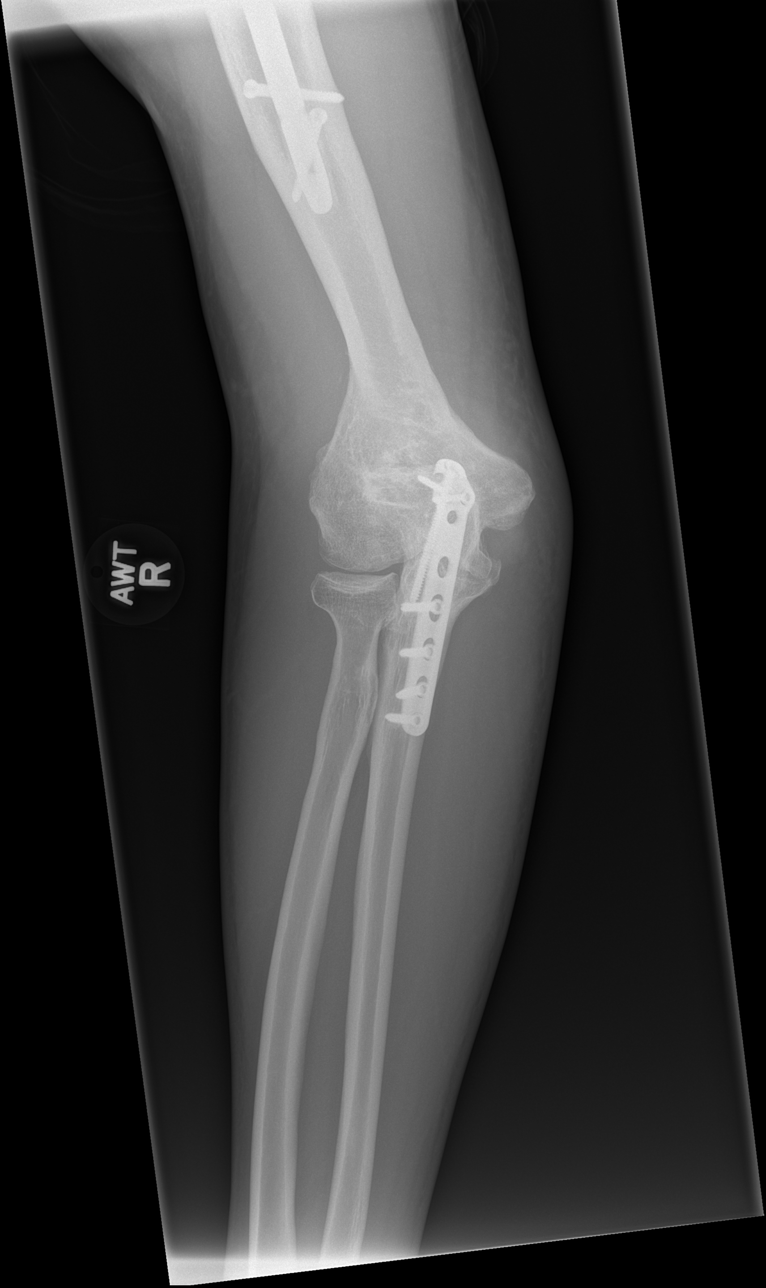

[x elbow obl right (2 of 2)]
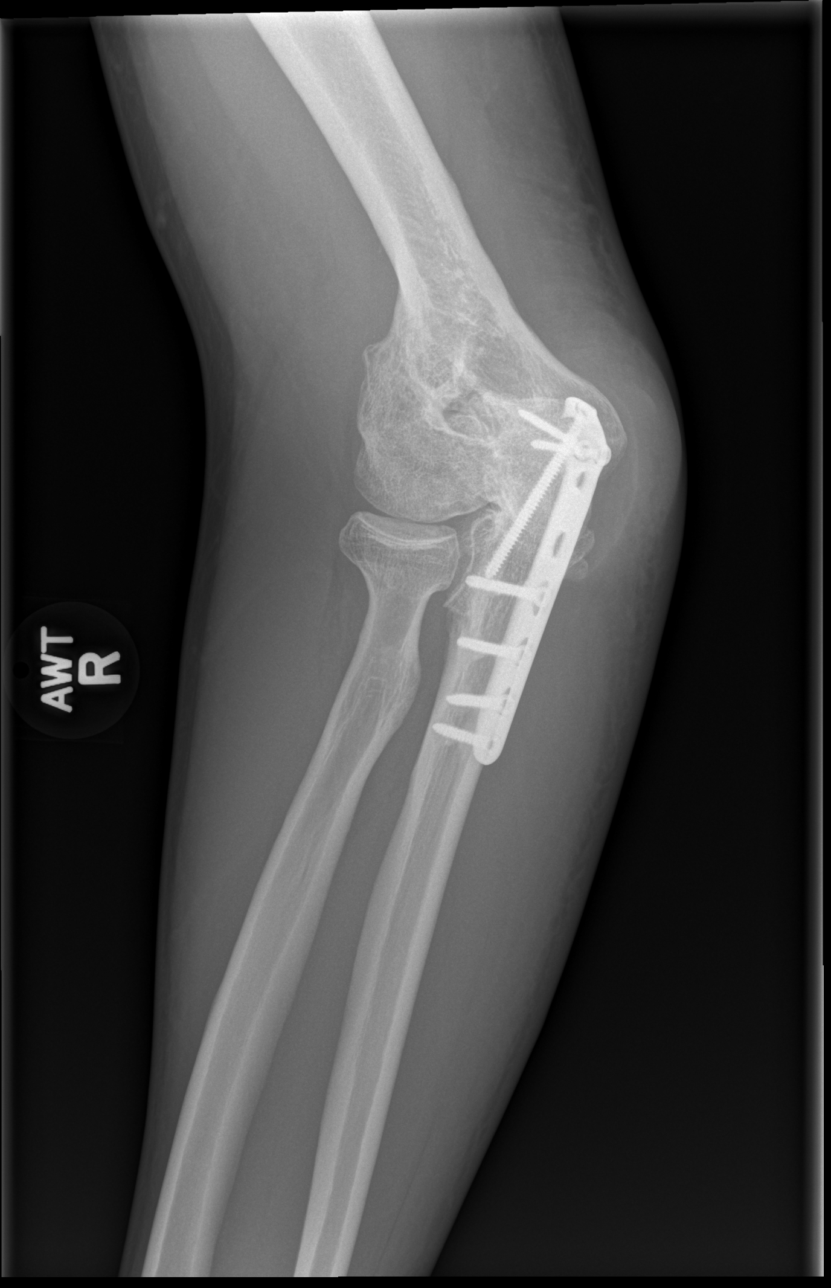

[x elbow lat right (1 of 2)]
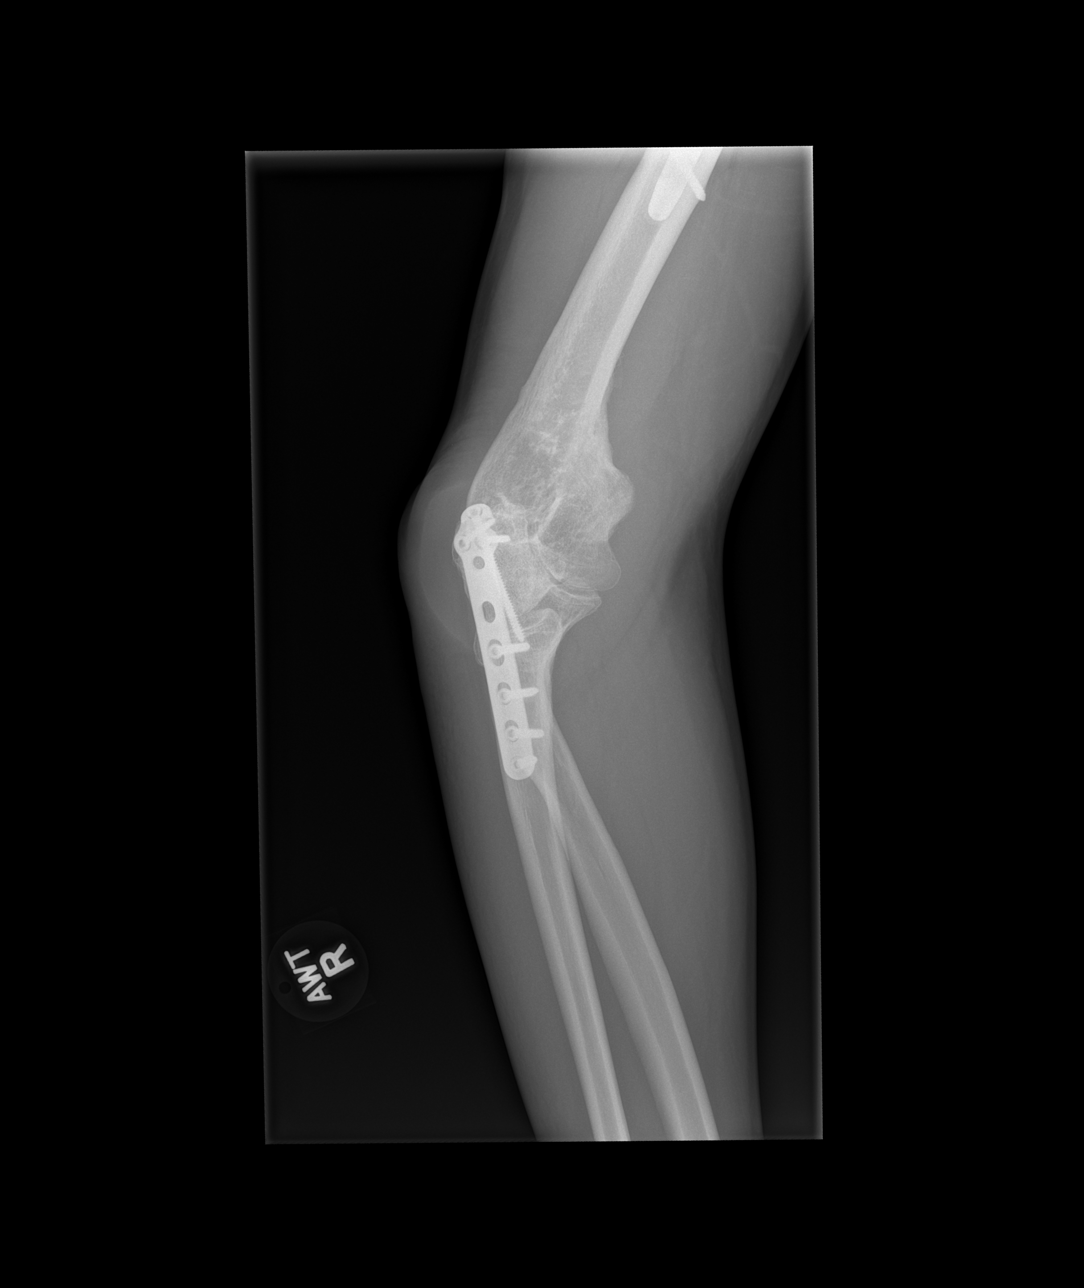

[x elbow lat right (2 of 2)]
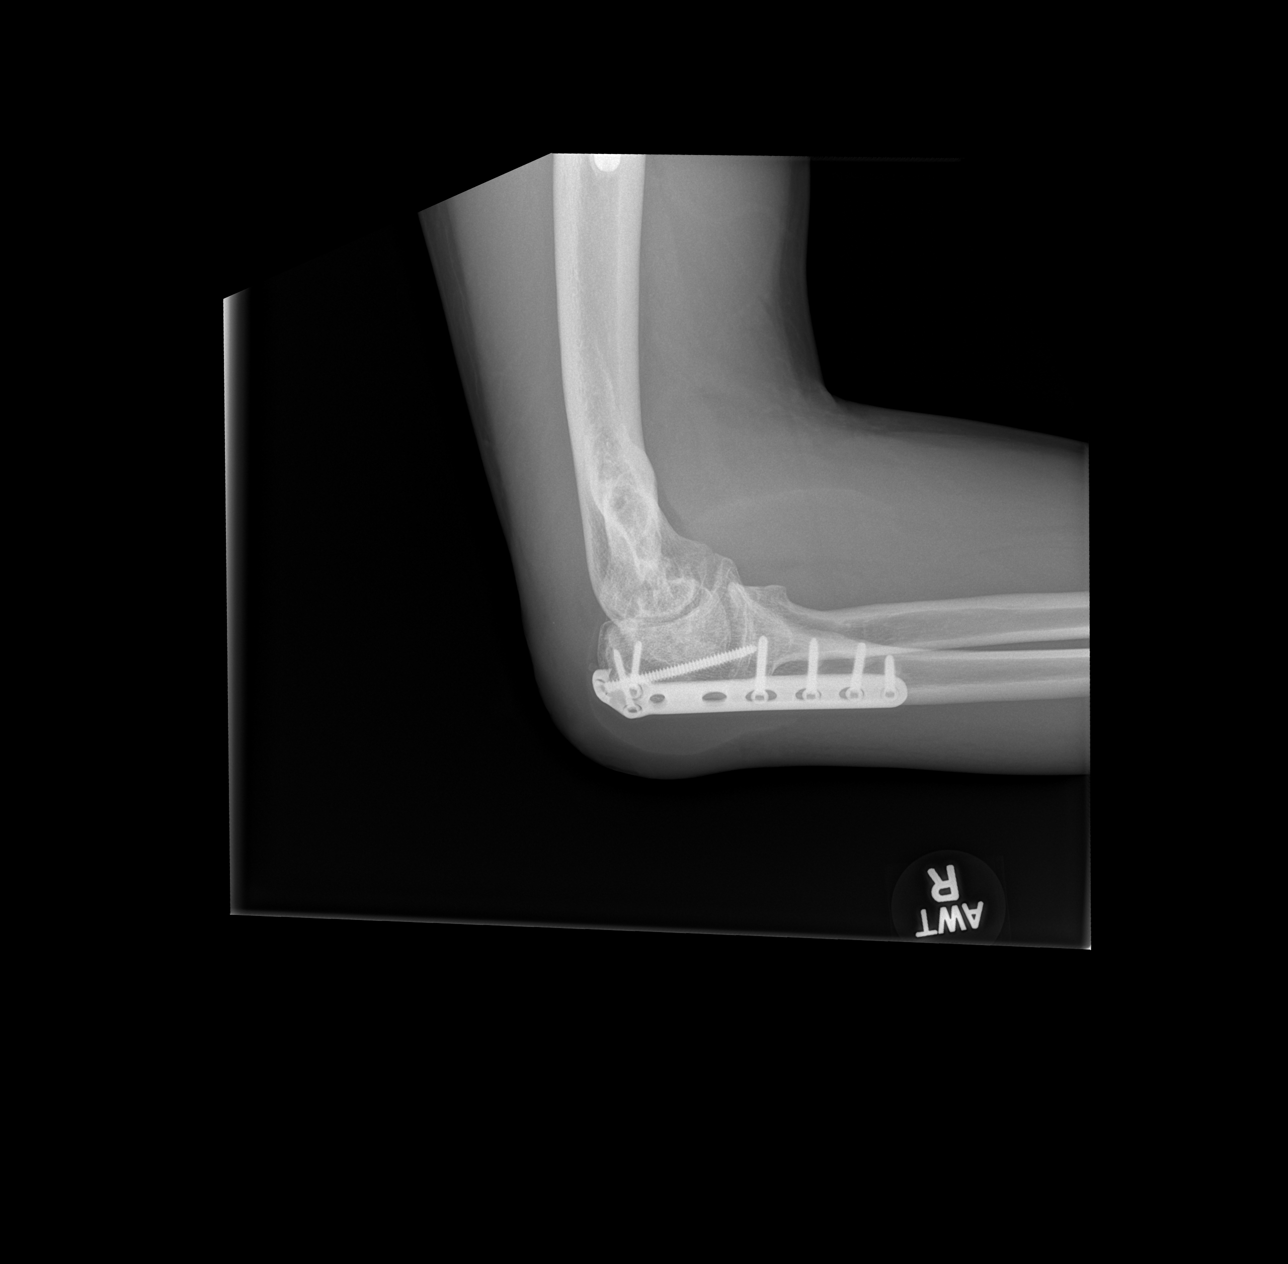

[4 of 4 positions shown; findings below may reference images not displayed]

FINDINGS: Frontal, lateral, and bilateral oblique views were obtained. There
is postoperative change throughout the proximal ulna as well as in
the distal humerus. No acute fracture or dislocation is seen. There
is no appreciable joint effusion. There is generalized
osteoarthritic change. There is no erosive change or bony
destruction appreciable. There is soft tissue swelling in the region
of the olecranon bursa. There is no air-fluid level to suggest
abscess.
IMPRESSION: Soft tissues swelling in the olecranon bursa region. No soft tissue
abscess is seen by radiography. There is postoperative change in
extensive osteoarthritic change. No bony destruction or erosion. No
fracture or dislocation.

## 2018-08-19 ENCOUNTER — Ambulatory Visit: Payer: PRIVATE HEALTH INSURANCE | Admitting: Internal Medicine
